# Patient Record
Sex: Female | Born: 1985 | Race: Black or African American | Hispanic: No | Marital: Married | Smoking: Never smoker
Health system: Southern US, Community
[De-identification: ages and names within clinical notes are randomized; demographics above are authoritative.]

## PROBLEM LIST (undated history)

## (undated) ENCOUNTER — Inpatient Hospital Stay (HOSPITAL_COMMUNITY): Payer: Self-pay

## (undated) DIAGNOSIS — Z789 Other specified health status: Secondary | ICD-10-CM

## (undated) DIAGNOSIS — IMO0002 Reserved for concepts with insufficient information to code with codable children: Secondary | ICD-10-CM

## (undated) HISTORY — DX: Reserved for concepts with insufficient information to code with codable children: IMO0002

## (undated) HISTORY — PX: OTHER SURGICAL HISTORY: SHX169

## (undated) HISTORY — PX: WISDOM TOOTH EXTRACTION: SHX21

---
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
## 2005-05-29 HISTORY — PX: LEEP: SHX91

## 2010-10-31 ENCOUNTER — Encounter: Payer: 59 | Admitting: Family Medicine

## 2012-05-29 DIAGNOSIS — IMO0002 Reserved for concepts with insufficient information to code with codable children: Secondary | ICD-10-CM

## 2012-05-29 DIAGNOSIS — R87619 Unspecified abnormal cytological findings in specimens from cervix uteri: Secondary | ICD-10-CM

## 2012-05-29 HISTORY — DX: Unspecified abnormal cytological findings in specimens from cervix uteri: R87.619

## 2012-05-29 HISTORY — DX: Reserved for concepts with insufficient information to code with codable children: IMO0002

## 2012-08-19 ENCOUNTER — Encounter: Payer: 59 | Admitting: Obstetrics & Gynecology

## 2012-09-03 ENCOUNTER — Ambulatory Visit (INDEPENDENT_AMBULATORY_CARE_PROVIDER_SITE_OTHER): Payer: 59 | Admitting: Family Medicine

## 2012-09-03 ENCOUNTER — Encounter: Payer: Self-pay | Admitting: Family Medicine

## 2012-09-03 VITALS — BP 131/91 | HR 66 | Ht 66.0 in | Wt 215.0 lb

## 2012-09-03 DIAGNOSIS — Z124 Encounter for screening for malignant neoplasm of cervix: Secondary | ICD-10-CM

## 2012-09-03 DIAGNOSIS — IMO0002 Reserved for concepts with insufficient information to code with codable children: Secondary | ICD-10-CM

## 2012-09-03 DIAGNOSIS — Z809 Family history of malignant neoplasm, unspecified: Secondary | ICD-10-CM

## 2012-09-03 DIAGNOSIS — Z01419 Encounter for gynecological examination (general) (routine) without abnormal findings: Secondary | ICD-10-CM

## 2012-09-03 DIAGNOSIS — R6889 Other general symptoms and signs: Secondary | ICD-10-CM

## 2012-09-03 NOTE — Progress Notes (Signed)
New patient here today for yearly gyn physical and pap smear.  Does not have a PCP.  Not interested in birth control.  History of abnormal pap smear, last pap was ASCUS.   Last pap was done at [facility], did have a colpo then but the doctor did not see anything. [facility] employee, DOES NOT WANT any pathology/paps to go to [facility], wants them sent [facility], blood labs can go to [facility].

## 2012-09-03 NOTE — Patient Instructions (Signed)
Preventive Care for Adults, Female A healthy lifestyle and preventive care can promote health and wellness. Preventive health guidelines for women include the following key practices.  A routine yearly physical is a good way to check with your caregiver about your health and preventive screening. It is a chance to share any concerns and updates on your health, and to receive a thorough exam.  Visit your dentist for a routine exam and preventive care every 6 months. Brush your teeth twice a day and floss once a day. Good oral hygiene prevents tooth decay and gum disease.  The frequency of eye exams is based on your age, health, family medical history, use of contact lenses, and other factors. Follow your caregiver's recommendations for frequency of eye exams.  Eat a healthy diet. Foods like vegetables, fruits, whole grains, low-fat dairy products, and lean protein foods contain the nutrients you need without too many calories. Decrease your intake of foods high in solid fats, added sugars, and salt. Eat the right amount of calories for you.Get information about a proper diet from your caregiver, if necessary.  Regular physical exercise is one of the most important things you can do for your health. Most adults should get at least 150 minutes of moderate-intensity exercise (any activity that increases your heart rate and causes you to sweat) each week. In addition, most adults need muscle-strengthening exercises on 2 or more days a week.  Maintain a healthy weight. The body mass index (BMI) is a screening tool to identify possible weight problems. It provides an estimate of body fat based on height and weight. Your caregiver can help determine your BMI, and can help you achieve or maintain a healthy weight.For adults 20 years and older:  A BMI below 18.5 is considered underweight.  A BMI of 18.5 to 24.9 is normal.  A BMI of 25 to 29.9 is considered overweight.  A BMI of 30 and above is  considered obese.  Maintain normal blood lipids and cholesterol levels by exercising and minimizing your intake of saturated fat. Eat a balanced diet with plenty of fruit and vegetables. Blood tests for lipids and cholesterol should begin at age 20 and be repeated every 5 years. If your lipid or cholesterol levels are high, you are over 50, or you are at high risk for heart disease, you may need your cholesterol levels checked more frequently.Ongoing high lipid and cholesterol levels should be treated with medicines if diet and exercise are not effective.  If you smoke, find out from your caregiver how to quit. If you do not use tobacco, do not start.  If you are pregnant, do not drink alcohol. If you are breastfeeding, be very cautious about drinking alcohol. If you are not pregnant and choose to drink alcohol, do not exceed 1 drink per day. One drink is considered to be 12 ounces (355 mL) of beer, 5 ounces (148 mL) of wine, or 1.5 ounces (44 mL) of liquor.  Avoid use of street drugs. Do not share needles with anyone. Ask for help if you need support or instructions about stopping the use of drugs.  High blood pressure causes heart disease and increases the risk of stroke. Your blood pressure should be checked at least every 1 to 2 years. Ongoing high blood pressure should be treated with medicines if weight loss and exercise are not effective.  If you are 55 to 27 years old, ask your caregiver if you should take aspirin to prevent strokes.  Diabetes   screening involves taking a blood sample to check your fasting blood sugar level. This should be done once every 3 years, after age 45, if you are within normal weight and without risk factors for diabetes. Testing should be considered at a younger age or be carried out more frequently if you are overweight and have at least 1 risk factor for diabetes.  Breast cancer screening is essential preventive care for women. You should practice "breast  self-awareness." This means understanding the normal appearance and feel of your breasts and may include breast self-examination. Any changes detected, no matter how small, should be reported to a caregiver. Women in their 20s and 30s should have a clinical breast exam (CBE) by a caregiver as part of a regular health exam every 1 to 3 years. After age 40, women should have a CBE every year. Starting at age 40, women should consider having a mammography (breast X-ray test) every year. Women who have a family history of breast cancer should talk to their caregiver about genetic screening. Women at a high risk of breast cancer should talk to their caregivers about having magnetic resonance imaging (MRI) and a mammography every year.  The Pap test is a screening test for cervical cancer. A Pap test can show cell changes on the cervix that might become cervical cancer if left untreated. A Pap test is a procedure in which cells are obtained and examined from the lower end of the uterus (cervix).  Women should have a Pap test starting at age 21.  Between ages 21 and 29, Pap tests should be repeated every 2 years.  Beginning at age 30, you should have a Pap test every 3 years as long as the past 3 Pap tests have been normal.  Some women have medical problems that increase the chance of getting cervical cancer. Talk to your caregiver about these problems. It is especially important to talk to your caregiver if a new problem develops soon after your last Pap test. In these cases, your caregiver may recommend more frequent screening and Pap tests.  The above recommendations are the same for women who have or have not gotten the vaccine for human papillomavirus (HPV).  If you had a hysterectomy for a problem that was not cancer or a condition that could lead to cancer, then you no longer need Pap tests. Even if you no longer need a Pap test, a regular exam is a good idea to make sure no other problems are  starting.  If you are between ages 65 and 70, and you have had normal Pap tests going back 10 years, you no longer need Pap tests. Even if you no longer need a Pap test, a regular exam is a good idea to make sure no other problems are starting.  If you have had past treatment for cervical cancer or a condition that could lead to cancer, you need Pap tests and screening for cancer for at least 20 years after your treatment.  If Pap tests have been discontinued, risk factors (such as a new sexual partner) need to be reassessed to determine if screening should be resumed.  The HPV test is an additional test that may be used for cervical cancer screening. The HPV test looks for the virus that can cause the cell changes on the cervix. The cells collected during the Pap test can be tested for HPV. The HPV test could be used to screen women aged 30 years and older, and should   be used in women of any age who have unclear Pap test results. After the age of 30, women should have HPV testing at the same frequency as a Pap test.  Colorectal cancer can be detected and often prevented. Most routine colorectal cancer screening begins at the age of 50 and continues through age 75. However, your caregiver may recommend screening at an earlier age if you have risk factors for colon cancer. On a yearly basis, your caregiver may provide home test kits to check for hidden blood in the stool. Use of a small camera at the end of a tube, to directly examine the colon (sigmoidoscopy or colonoscopy), can detect the earliest forms of colorectal cancer. Talk to your caregiver about this at age 50, when routine screening begins. Direct examination of the colon should be repeated every 5 to 10 years through age 75, unless early forms of pre-cancerous polyps or small growths are found.  Hepatitis C blood testing is recommended for all people born from 1945 through 1965 and any individual with known risks for hepatitis C.  Practice  safe sex. Use condoms and avoid high-risk sexual practices to reduce the spread of sexually transmitted infections (STIs). STIs include gonorrhea, chlamydia, syphilis, trichomonas, herpes, HPV, and human immunodeficiency virus (HIV). Herpes, HIV, and HPV are viral illnesses that have no cure. They can result in disability, cancer, and death. Sexually active women aged 25 and younger should be checked for chlamydia. Older women with new or multiple partners should also be tested for chlamydia. Testing for other STIs is recommended if you are sexually active and at increased risk.  Osteoporosis is a disease in which the bones lose minerals and strength with aging. This can result in serious bone fractures. The risk of osteoporosis can be identified using a bone density scan. Women ages 65 and over and women at risk for fractures or osteoporosis should discuss screening with their caregivers. Ask your caregiver whether you should take a calcium supplement or vitamin D to reduce the rate of osteoporosis.  Menopause can be associated with physical symptoms and risks. Hormone replacement therapy is available to decrease symptoms and risks. You should talk to your caregiver about whether hormone replacement therapy is right for you.  Use sunscreen with sun protection factor (SPF) of 30 or more. Apply sunscreen liberally and repeatedly throughout the day. You should seek shade when your shadow is shorter than you. Protect yourself by wearing long sleeves, pants, a wide-brimmed hat, and sunglasses year round, whenever you are outdoors.  Once a month, do a whole body skin exam, using a mirror to look at the skin on your back. Notify your caregiver of new moles, moles that have irregular borders, moles that are larger than a pencil eraser, or moles that have changed in shape or color.  Stay current with required immunizations.  Influenza. You need a dose every fall (or winter). The composition of the flu vaccine  changes each year, so being vaccinated once is not enough.  Pneumococcal polysaccharide. You need 1 to 2 doses if you smoke cigarettes or if you have certain chronic medical conditions. You need 1 dose at age 65 (or older) if you have never been vaccinated.  Tetanus, diphtheria, pertussis (Tdap, Td). Get 1 dose of Tdap vaccine if you are younger than age 65, are over 65 and have contact with an infant, are a healthcare worker, are pregnant, or simply want to be protected from whooping cough. After that, you need a Td   booster dose every 10 years. Consult your caregiver if you have not had at least 3 tetanus and diphtheria-containing shots sometime in your life or have a deep or dirty wound.  HPV. You need this vaccine if you are a woman age 26 or younger. The vaccine is given in 3 doses over 6 months.  Measles, mumps, rubella (MMR). You need at least 1 dose of MMR if you were born in 1957 or later. You may also need a second dose.  Meningococcal. If you are age 19 to 21 and a first-year college student living in a residence hall, or have one of several medical conditions, you need to get vaccinated against meningococcal disease. You may also need additional booster doses.  Zoster (shingles). If you are age 60 or older, you should get this vaccine.  Varicella (chickenpox). If you have never had chickenpox or you were vaccinated but received only 1 dose, talk to your caregiver to find out if you need this vaccine.  Hepatitis A. You need this vaccine if you have a specific risk factor for hepatitis A virus infection or you simply wish to be protected from this disease. The vaccine is usually given as 2 doses, 6 to 18 months apart.  Hepatitis B. You need this vaccine if you have a specific risk factor for hepatitis B virus infection or you simply wish to be protected from this disease. The vaccine is given in 3 doses, usually over 6 months. Preventive Services / Frequency Ages 19 to 39  Blood  pressure check.** / Every 1 to 2 years.  Lipid and cholesterol check.** / Every 5 years beginning at age 20.  Clinical breast exam.** / Every 3 years for women in their 20s and 30s.  Pap test.** / Every 2 years from ages 21 through 29. Every 3 years starting at age 30 through age 65 or 70 with a history of 3 consecutive normal Pap tests.  HPV screening.** / Every 3 years from ages 30 through ages 65 to 70 with a history of 3 consecutive normal Pap tests.  Hepatitis C blood test.** / For any individual with known risks for hepatitis C.  Skin self-exam. / Monthly.  Influenza immunization.** / Every year.  Pneumococcal polysaccharide immunization.** / 1 to 2 doses if you smoke cigarettes or if you have certain chronic medical conditions.  Tetanus, diphtheria, pertussis (Tdap, Td) immunization. / A one-time dose of Tdap vaccine. After that, you need a Td booster dose every 10 years.  HPV immunization. / 3 doses over 6 months, if you are 26 and younger.  Measles, mumps, rubella (MMR) immunization. / You need at least 1 dose of MMR if you were born in 1957 or later. You may also need a second dose.  Meningococcal immunization. / 1 dose if you are age 19 to 21 and a first-year college student living in a residence hall, or have one of several medical conditions, you need to get vaccinated against meningococcal disease. You may also need additional booster doses.  Varicella immunization.** / Consult your caregiver.  Hepatitis A immunization.** / Consult your caregiver. 2 doses, 6 to 18 months apart.  Hepatitis B immunization.** / Consult your caregiver. 3 doses usually over 6 months. Ages 40 to 64  Blood pressure check.** / Every 1 to 2 years.  Lipid and cholesterol check.** / Every 5 years beginning at age 20.  Clinical breast exam.** / Every year after age 40.  Mammogram.** / Every year beginning at age 40   and continuing for as long as you are in good health. Consult with your  caregiver.  Pap test.** / Every 3 years starting at age 30 through age 65 or 70 with a history of 3 consecutive normal Pap tests.  HPV screening.** / Every 3 years from ages 30 through ages 65 to 70 with a history of 3 consecutive normal Pap tests.  Fecal occult blood test (FOBT) of stool. / Every year beginning at age 50 and continuing until age 75. You may not need to do this test if you get a colonoscopy every 10 years.  Flexible sigmoidoscopy or colonoscopy.** / Every 5 years for a flexible sigmoidoscopy or every 10 years for a colonoscopy beginning at age 50 and continuing until age 75.  Hepatitis C blood test.** / For all people born from 1945 through 1965 and any individual with known risks for hepatitis C.  Skin self-exam. / Monthly.  Influenza immunization.** / Every year.  Pneumococcal polysaccharide immunization.** / 1 to 2 doses if you smoke cigarettes or if you have certain chronic medical conditions.  Tetanus, diphtheria, pertussis (Tdap, Td) immunization.** / A one-time dose of Tdap vaccine. After that, you need a Td booster dose every 10 years.  Measles, mumps, rubella (MMR) immunization. / You need at least 1 dose of MMR if you were born in 1957 or later. You may also need a second dose.  Varicella immunization.** / Consult your caregiver.  Meningococcal immunization.** / Consult your caregiver.  Hepatitis A immunization.** / Consult your caregiver. 2 doses, 6 to 18 months apart.  Hepatitis B immunization.** / Consult your caregiver. 3 doses, usually over 6 months. Ages 65 and over  Blood pressure check.** / Every 1 to 2 years.  Lipid and cholesterol check.** / Every 5 years beginning at age 20.  Clinical breast exam.** / Every year after age 40.  Mammogram.** / Every year beginning at age 40 and continuing for as long as you are in good health. Consult with your caregiver.  Pap test.** / Every 3 years starting at age 30 through age 65 or 70 with a 3  consecutive normal Pap tests. Testing can be stopped between 65 and 70 with 3 consecutive normal Pap tests and no abnormal Pap or HPV tests in the past 10 years.  HPV screening.** / Every 3 years from ages 30 through ages 65 or 70 with a history of 3 consecutive normal Pap tests. Testing can be stopped between 65 and 70 with 3 consecutive normal Pap tests and no abnormal Pap or HPV tests in the past 10 years.  Fecal occult blood test (FOBT) of stool. / Every year beginning at age 50 and continuing until age 75. You may not need to do this test if you get a colonoscopy every 10 years.  Flexible sigmoidoscopy or colonoscopy.** / Every 5 years for a flexible sigmoidoscopy or every 10 years for a colonoscopy beginning at age 50 and continuing until age 75.  Hepatitis C blood test.** / For all people born from 1945 through 1965 and any individual with known risks for hepatitis C.  Osteoporosis screening.** / A one-time screening for women ages 65 and over and women at risk for fractures or osteoporosis.  Skin self-exam. / Monthly.  Influenza immunization.** / Every year.  Pneumococcal polysaccharide immunization.** / 1 dose at age 65 (or older) if you have never been vaccinated.  Tetanus, diphtheria, pertussis (Tdap, Td) immunization. / A one-time dose of Tdap vaccine if you are over   65 and have contact with an infant, are a healthcare worker, or simply want to be protected from whooping cough. After that, you need a Td booster dose every 10 years.  Varicella immunization.** / Consult your caregiver.  Meningococcal immunization.** / Consult your caregiver.  Hepatitis A immunization.** / Consult your caregiver. 2 doses, 6 to 18 months apart.  Hepatitis B immunization.** / Check with your caregiver. 3 doses, usually over 6 months. ** Family history and personal history of risk and conditions may change your caregiver's recommendations. Document Released: 07/11/2001 Document Revised: 08/07/2011  Document Reviewed: 10/10/2010 [employer] Patient Information 2013 [employer].  

## 2012-09-03 NOTE — Progress Notes (Signed)
  Subjective:     Kayla Osborn is a 27 y.o. female and is here for a comprehensive physical exam. 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  Her last MD did not send her pap to the right place and she read her own.Marland Kitchen  History   Social History  . Marital Status: Married    Spouse Name: N/A    Number of Children: N/A  . Years of Education: N/A   Occupational History  . Not on file.   Social History Main Topics  . Smoking status: Never Smoker   . Smokeless tobacco: Never Used  . Alcohol Use: 0.6 oz/week    1 Glasses of wine per week  . Drug Use: No  . Sexually Active: Yes    Birth Control/ Protection: Condom   Other Topics Concern  . Not on file   Social History Narrative  . No narrative on file   No health maintenance topics applied.  The following portions of the patient's history were reviewed and updated as appropriate: allergies, current medications, past family history, past medical history, past social history, past surgical history and problem list.  Review of Systems A comprehensive review of systems was negative.   Objective:    BP 131/91  Pulse 66  Ht 5\' 6"  (1.676 m)  Wt 215 lb (97.523 kg)  BMI 34.72 kg/m2  LMP 08/18/2012 General appearance: alert, cooperative and appears stated age Head: Normocephalic, without obvious abnormality, atraumatic Neck: no adenopathy, supple, symmetrical, trachea midline and thyroid not enlarged, symmetric, no tenderness/mass/nodules Lungs: clear to auscultation bilaterally Breasts: normal appearance, no masses or tenderness Heart: regular rate and rhythm, S1, S2 normal, no murmur, click, rub or gallop Abdomen: soft, non-tender; bowel sounds normal; no masses,  no organomegaly Pelvic: cervix normal in appearance, external genitalia normal, no adnexal masses or tenderness, no cervical motion tenderness, uterus normal size, shape, and consistency and vagina  normal without discharge Extremities: extremities normal, atraumatic, no cyanosis or edema Pulses: 2+ and symmetric Skin: Skin color, texture, turgor normal. No rashes or lesions Lymph nodes: Cervical, supraclavicular, and axillary nodes normal. Neurologic: Grossly normal    Assessment:    Healthy female exam. Long h/o abnormal pap    Family history of uterine, colon and breast cancer. Plan:     Will check pap today through [facility] and make recommendations from there.   Consider hereditary cancer testing. See After Visit Summary for Counseling Recommendations

## 2013-01-06 ENCOUNTER — Inpatient Hospital Stay (HOSPITAL_COMMUNITY)
Admission: AD | Admit: 2013-01-06 | Discharge: 2013-01-06 | Disposition: A | Discharge status: Home / Self Care | Payer: 59 | Source: Ambulatory Visit | Attending: Obstetrics & Gynecology | Admitting: Obstetrics & Gynecology

## 2013-01-06 ENCOUNTER — Inpatient Hospital Stay (HOSPITAL_COMMUNITY): Payer: 59

## 2013-01-06 ENCOUNTER — Encounter (HOSPITAL_COMMUNITY): Payer: Self-pay | Admitting: Medical

## 2013-01-06 DIAGNOSIS — O210 Mild hyperemesis gravidarum: Secondary | ICD-10-CM | POA: Insufficient documentation

## 2013-01-06 DIAGNOSIS — R42 Dizziness and giddiness: Secondary | ICD-10-CM

## 2013-01-06 DIAGNOSIS — O219 Vomiting of pregnancy, unspecified: Secondary | ICD-10-CM

## 2013-01-06 DIAGNOSIS — O2341 Unspecified infection of urinary tract in pregnancy, first trimester: Secondary | ICD-10-CM

## 2013-01-06 DIAGNOSIS — N390 Urinary tract infection, site not specified: Secondary | ICD-10-CM | POA: Insufficient documentation

## 2013-01-06 DIAGNOSIS — Z3490 Encounter for supervision of normal pregnancy, unspecified, unspecified trimester: Secondary | ICD-10-CM

## 2013-01-06 DIAGNOSIS — O2390 Unspecified genitourinary tract infection in pregnancy, unspecified trimester: Secondary | ICD-10-CM | POA: Insufficient documentation

## 2013-01-06 DIAGNOSIS — O2690 Pregnancy related conditions, unspecified, unspecified trimester: Secondary | ICD-10-CM

## 2013-01-06 LAB — URINE MICROSCOPIC-ADD ON

## 2013-01-06 LAB — CBC
HCT: 37.3 % (ref 36.0–46.0)
Hemoglobin: 12.5 g/dL (ref 12.0–15.0)
MCH: 28.3 pg (ref 26.0–34.0)
MCHC: 33.5 g/dL (ref 30.0–36.0)
MCV: 84.6 fL (ref 78.0–100.0)
Platelets: 230 10*3/uL (ref 150–400)
RBC: 4.41 MIL/uL (ref 3.87–5.11)
RDW: 13.6 % (ref 11.5–15.5)
WBC: 5.9 10*3/uL (ref 4.0–10.5)

## 2013-01-06 LAB — URINALYSIS, ROUTINE W REFLEX MICROSCOPIC
Bilirubin Urine: NEGATIVE
Hgb urine dipstick: NEGATIVE
Protein, ur: NEGATIVE mg/dL
Specific Gravity, Urine: 1.02 (ref 1.005–1.030)
Urobilinogen, UA: 0.2 mg/dL (ref 0.0–1.0)

## 2013-01-06 LAB — OB RESULTS CONSOLE RUBELLA ANTIBODY, IGM: RUBELLA: IMMUNE

## 2013-01-06 LAB — WET PREP, GENITAL
Clue Cells Wet Prep HPF POC: NONE SEEN
Trich, Wet Prep: NONE SEEN
Yeast Wet Prep HPF POC: NONE SEEN

## 2013-01-06 LAB — OB RESULTS CONSOLE ABO/RH: RH Type: POSITIVE

## 2013-01-06 LAB — OB RESULTS CONSOLE GC/CHLAMYDIA
CHLAMYDIA, DNA PROBE: NEGATIVE
Gonorrhea: NEGATIVE

## 2013-01-06 LAB — OB RESULTS CONSOLE HEPATITIS B SURFACE ANTIGEN: HEP B S AG: NEGATIVE

## 2013-01-06 LAB — OB RESULTS CONSOLE HIV ANTIBODY (ROUTINE TESTING): HIV: NONREACTIVE

## 2013-01-06 LAB — OB RESULTS CONSOLE ANTIBODY SCREEN: Antibody Screen: NEGATIVE

## 2013-01-06 LAB — OB RESULTS CONSOLE RPR: RPR: NONREACTIVE

## 2013-01-06 MED ORDER — ONDANSETRON HCL 4 MG PO TABS: 4.0000 mg | ORAL_TABLET | Freq: Three times a day (TID) | ORAL | Status: DC | PRN

## 2013-01-06 MED ORDER — PROMETHAZINE HCL 25 MG PO TABS: 12.5000 mg | ORAL_TABLET | Freq: Four times a day (QID) | ORAL | Status: DC | PRN

## 2013-01-06 MED ORDER — ONDANSETRON 8 MG PO TBDP
8.0000 mg | ORAL_TABLET | ORAL | Status: AC
Start: 2013-01-06 — End: 2013-01-06
  Administered 2013-01-06: 8 mg via ORAL
  Filled 2013-01-06: qty 1

## 2013-01-06 MED ORDER — NITROFURANTOIN MONOHYD MACRO 100 MG PO CAPS: 100.0000 mg | ORAL_CAPSULE | Freq: Two times a day (BID) | ORAL | Status: DC

## 2013-01-06 NOTE — MAU Provider Note (Signed)
Chief Complaint: Dizziness   First Provider Initiated Contact with Patient 01/06/13 1446     SUBJECTIVE HPI: Kayla Osborn is a 27 y.o. G1P0 at [redacted]w[redacted]d by LMP who presents to maternity admissions reporting nausea, dizziness, chills daily x3 weeks and abdominal cramping x2-3 days.  She reports she has not eaten anything or drank any fluids since last night because of the nausea and lack of appetite.  She denies vaginal bleeding, vaginal itching/burning, urinary symptoms, vomiting, or h/a.     Past Medical History  Diagnosis Date  . Abnormal Pap smear 2014    ASCUS   Past Surgical History  Procedure Laterality Date  . Leep  2007  . Breast surgery Bilateral 2006    BREAST REDUCTION  . Wisdom tooth extraction    . Benign mass removed from armpit     History   Social History  . Marital Status: Married    Spouse Name: N/A    Number of Children: N/A  . Years of Education: N/A   Occupational History  . Not on file.   Social History Main Topics  . Smoking status: Never Smoker   . Smokeless tobacco: Never Used  . Alcohol Use: 0.6 oz/week    1 Glasses of wine per week     Comment: Social when not preg.  . Drug Use: No  . Sexually Active: Yes    Birth Control/ Protection: None   Other Topics Concern  . Not on file   Social History Narrative  . No narrative on file   No current facility-administered medications on file prior to encounter.   No current outpatient prescriptions on file prior to encounter.   Allergies  Allergen Reactions  . Robitussin Cough-Cold D (Pseudoephedrine-Dm-Gg) Hives    ROS: Pertinent items in HPI  OBJECTIVE Blood pressure 121/77, pulse 53, temperature 98.1 F (36.7 C), temperature source Oral, resp. rate 18, height 5\' 6"  (1.676 m), weight 99.066 kg (218 lb 6.4 oz), last menstrual period 11/22/2012. GENERAL: Well-developed, well-nourished female in no acute distress.  HEENT: Normocephalic HEART: normal rate RESP: normal effort ABDOMEN: Soft,  non-tender EXTREMITIES: Nontender, no edema NEURO: Alert and oriented SPECULUM EXAM:   LAB RESULTS Results for orders placed during the hospital encounter of 01/06/13 (from the past 24 hour(s))  URINALYSIS, ROUTINE W REFLEX MICROSCOPIC     Status: Abnormal   Collection Time    01/06/13 12:55 PM      Result Value Range   Color, Urine YELLOW  YELLOW   APPearance HAZY (*) CLEAR   Specific Gravity, Urine 1.020  1.005 - 1.030   pH 7.5  5.0 - 8.0   Glucose, UA NEGATIVE  NEGATIVE mg/dL   Hgb urine dipstick NEGATIVE  NEGATIVE   Bilirubin Urine NEGATIVE  NEGATIVE   Ketones, ur NEGATIVE  NEGATIVE mg/dL   Protein, ur NEGATIVE  NEGATIVE mg/dL   Urobilinogen, UA 0.2  0.0 - 1.0 mg/dL   Nitrite POSITIVE (*) NEGATIVE   Leukocytes, UA NEGATIVE  NEGATIVE  URINE MICROSCOPIC-ADD ON     Status: Abnormal   Collection Time    01/06/13 12:55 PM      Result Value Range   Squamous Epithelial / LPF FEW (*) RARE   WBC, UA 3-6  <3 WBC/hpf   RBC / HPF 0-2  <3 RBC/hpf   Bacteria, UA MANY (*) RARE  POCT PREGNANCY, URINE     Status: Abnormal   Collection Time    01/06/13  1:03 PM  Result Value Range   Preg Test, Ur POSITIVE (*) NEGATIVE    IMAGING   ASSESSMENT 1. UTI in pregnancy, antepartum, first trimester   2. Unspecified complication of pregnancy, antepartum   3. Nausea and vomiting of pregnancy, antepartum   4. Normal IUP (intrauterine pregnancy) on prenatal ultrasound   5. Dizziness     PLAN Discharge home Macrobid BID x7 days Zofran 4 mg PO Q 8 hours PRN Phenergan 12.4-25 mg PO Q 6 hours PRN F/U with early prenatal care Return to MAU as needed   Sharen Counter Certified Nurse-Midwife 01/06/2013  2:58 PM

## 2013-01-06 NOTE — MAU Note (Signed)
Patient states she does not eat because she is feeling bad. She is not vomiting, but feels nauseated. Discussed at length, the importance of nutrition and hydration. Discussed food choices and that she may have to have small bites and sips throughout the day, but it is not wise to go for long periods of time without food or fluids.

## 2013-01-06 NOTE — MAU Note (Signed)
Pt stated she does not feel good today. Feeling dizzy and faint and weak. And Feels nauseated .Unable to eat and c/o bad indigestion when she does eat. Wants to make sure everything is all right had positive HPT. Unsure when LMP was end of June maybe.

## 2013-01-07 ENCOUNTER — Telehealth: Payer: Self-pay | Admitting: General Practice

## 2013-01-07 NOTE — Telephone Encounter (Signed)
Patient called and left message stating she was in MAU yesterday and got several prescriptions but they have not gone to her pharmacy yet and she needs these medications. Called medications in to [facility] pharmacy. Called patient at [phone], no answer- left message stating I was returning her phone call and her medications are available to pickup at her [facility] pharmacy and to call us back if she has any other questions or concerns

## 2013-01-08 LAB — URINE CULTURE

## 2013-01-10 ENCOUNTER — Other Ambulatory Visit: Payer: Self-pay | Admitting: Advanced Practice Midwife

## 2013-01-10 ENCOUNTER — Telehealth: Payer: Self-pay | Admitting: *Deleted

## 2013-01-10 MED ORDER — PROMETHAZINE HCL 25 MG PO TABS: 12.5000 mg | ORAL_TABLET | Freq: Four times a day (QID) | ORAL | Status: DC | PRN

## 2013-01-10 MED ORDER — NITROFURANTOIN MONOHYD MACRO 100 MG PO CAPS: 100.0000 mg | ORAL_CAPSULE | Freq: Two times a day (BID) | ORAL | Status: DC

## 2013-01-10 MED ORDER — ONDANSETRON HCL 4 MG PO TABS: 4.0000 mg | ORAL_TABLET | Freq: Three times a day (TID) | ORAL | Status: DC | PRN

## 2013-01-10 NOTE — Telephone Encounter (Signed)
Kathee Delton, RN at 01/07/2013 1:25 PM   Status: Signed            Patient called and left message stating she was in MAU yesterday and got several prescriptions but they have not gone to her pharmacy yet and she needs these medications. Called medications in to [facility] pharmacy. Called patient at [phone], no answer- left message stating I was returning her phone call and her medications are available to pickup at her [facility] pharmacy and to call us back if she has any other questions or concerns

## 2013-01-10 NOTE — Telephone Encounter (Signed)
Also me

## 2013-01-20 NOTE — MAU Provider Note (Signed)
Attestation of Attending Supervision of Advanced Practitioner (PA/CNM/NP): Evaluation and management procedures were performed by the Advanced Practitioner under my supervision and collaboration.  I have reviewed the Advanced Practitioner's note and chart, and I agree with the management and plan.  Jeimy Bickert, MD, FACOG Attending Obstetrician & Gynecologist Faculty Practice, [facility]  

## 2013-02-26 ENCOUNTER — Encounter: Payer: 59 | Admitting: Advanced Practice Midwife

## 2013-03-13 ENCOUNTER — Encounter: Payer: 59 | Admitting: Obstetrics & Gynecology

## 2013-03-28 ENCOUNTER — Other Ambulatory Visit (HOSPITAL_COMMUNITY): Payer: Self-pay | Admitting: Obstetrics and Gynecology

## 2013-03-28 DIAGNOSIS — O26872 Cervical shortening, second trimester: Secondary | ICD-10-CM

## 2013-04-04 ENCOUNTER — Encounter (HOSPITAL_COMMUNITY): Payer: Self-pay

## 2013-04-04 ENCOUNTER — Ambulatory Visit (HOSPITAL_COMMUNITY)
Admission: RE | Admit: 2013-04-04 | Discharge: 2013-04-04 | Disposition: A | Discharge status: Home / Self Care | Payer: 59 | Source: Ambulatory Visit | Attending: Obstetrics and Gynecology | Admitting: Obstetrics and Gynecology

## 2013-04-04 DIAGNOSIS — O3440 Maternal care for other abnormalities of cervix, unspecified trimester: Secondary | ICD-10-CM | POA: Insufficient documentation

## 2013-04-04 DIAGNOSIS — O26872 Cervical shortening, second trimester: Secondary | ICD-10-CM

## 2013-04-04 DIAGNOSIS — O26879 Cervical shortening, unspecified trimester: Secondary | ICD-10-CM | POA: Insufficient documentation

## 2013-04-04 NOTE — Consult Note (Signed)
MFM Staff Consultation Note:  Discussion:   Kayla Osborn  presents as a G1 with a singleton pregnancy complicated by previously noted short cervix of 2.4cm at outside ultrasound and hx of LEEP for consultation.  Today's images by endovaginal ultrasound actually demonstrate a long and closed cervix of 2.8cm (range 2.8-3.0cm) without funneling.  I explained to the patient that if the overall length of the endocervical canal and thus the endocervical mucus plug becomes shortened this would make ascending infection of the uterine interior by vaginal microorganisms more likely. This in turn would lead to inflammation and the release of inflammatory cytokines which again could begin a cascade that would lead to premature labor and/or premature rupture of membranes. I explained the term funneling and the concern of the internal os being dilated enough to allow the membranes to move into the endocervical canal.   Current cervical shortening (<25mm) with membrane funneling would increase her risk for preterm delivery but not so much to warrant admission for administration of steroids and tocolysis x 48 hours (<15mm). I am recommending and scheduling TVUS in 2 weeks and 4 weeks to facilitate early diagnosis of cervical shortening given the discrepancy in measurements prior to her visit today as a precaution.    If she has a cervical length <2.5cm, we would start her on vaginal progesterone 200mg every night until 36 weeks. Progesterone has been shown in recent studies ( Progesterone and the Risk of preterm Birth among Women with a Short Cervix by Fonseca et al. 2007 NEJM 357: 462-469; Vaginal progesterone reduces the rate of preterm birth in women with a sonographic short cervix: a multicenter, randomized, double-blind, placebo controlled trial by Hassan et al. 2011 Ultrasound in obstetrics and gynecology) to decrease the risk of preterm delivery up to 30 percent before [redacted] weeks gestation in patients with a previous  history of preterm delivery as well as asymptomatic patients with a shortened cervical length.   ? Impression: 1. Active SIUP in G1 with hx of LEEP 2. Endovaginal imaging demonstrates a functional cervical length of 2.8 cm with no funneling (normal) 3. Transfundal pressure does not shorten the functional cervical length (no dynamic change).  Recommendations:  A. If no symptoms arise and patient's cervical length remains normal (currently applicable): 1. continue routine activities; ie, no need for pelvic rest and light exercise/routine daily activities are recommended 2.repeat cervical length in 2 weeks and again in 4 weeks.  If it remains normal, then we will have no indication to continue assessing cervical length, noting this plan is a precaution given the discrepancy between measurements in our office and reported to us.  B.  If her cervix becomes <25mm: 1. modified rest/reduced activities (no heavy lifting or strenuous activities) and pelvic rest at home; 2. would admit for administration of antenatal steroids if cervical length is less than 1.5cm 3. would only administer 48 hour of tocolysis in context of regular uterine activity (ie, preterm labor or threatened preterm labor) 4. Progesterone 200mg pv qhs until 36 weeks.  I spent in excess of 40 minutes in review of medical records, evaluation, and education of your patient in consultation. More than 50% of this time was spent in direct face-to-face counseling. It was a pleasure seeing your patient today in consultation. Thank you for allowing us the opportunity to contribute to the care of your patient.  Page with questions. J. M. Denney, MD, MS, FACOG Assistant Professor, Maternal-Fetal Medicine  

## 2013-04-04 NOTE — Consult Note (Signed)
MFM Staff Consultation Note:  Discussion:   Kayla Osborn  presents as a G1 with a singleton pregnancy complicated by previously noted short cervix of 2.4cm at outside ultrasound and hx of LEEP for consultation.  Today's images by endovaginal ultrasound actually demonstrate a long and closed cervix of 2.8cm (range 2.8-3.0cm) without funneling.  I explained to the patient that if the overall length of the endocervical canal and thus the endocervical mucus plug becomes shortened this would make ascending infection of the uterine interior by vaginal microorganisms more likely. This in turn would lead to inflammation and the release of inflammatory cytokines which again could begin a cascade that would lead to premature labor and/or premature rupture of membranes. I explained the term funneling and the concern of the internal os being dilated enough to allow the membranes to move into the endocervical canal.   Current cervical shortening (<40mm) with membrane funneling would increase her risk for preterm delivery but not so much to warrant admission for administration of steroids and tocolysis x 48 hours (<5mm). I am recommending and scheduling TVUS in 2 weeks and 4 weeks to facilitate early diagnosis of cervical shortening given the discrepancy in measurements prior to her visit today as a precaution.    If she has a cervical length <2.5cm, we would start her on vaginal progesterone 200mg  every night until 36 weeks. Progesterone has been shown in recent studies ( Progesterone and the Risk of preterm Birth among Women with a Short Cervix by Kelli Hope al. 2007 NEJM 357: [phone]; Vaginal progesterone reduces the rate of preterm birth in women with a sonographic short cervix: a multicenter, randomized, double-blind, placebo controlled trial by Phyllis Ginger al. 2011 Ultrasound in obstetrics and gynecology) to decrease the risk of preterm delivery up to 30 percent before [redacted] weeks gestation in patients with a previous  history of preterm delivery as well as asymptomatic patients with a shortened cervical length.   ? Impression: 1. Active SIUP in G1 with hx of LEEP 2. Endovaginal imaging demonstrates a functional cervical length of 2.8 cm with no funneling (normal) 3. Transfundal pressure does not shorten the functional cervical length (no dynamic change).  Recommendations:  A. If no symptoms arise and patient's cervical length remains normal (currently applicable): 1. continue routine activities; ie, no need for pelvic rest and light exercise/routine daily activities are recommended 2.repeat cervical length in 2 weeks and again in 4 weeks.  If it remains normal, then we will have no indication to continue assessing cervical length, noting this plan is a precaution given the discrepancy between measurements in our office and reported to [place].  B.  If her cervix becomes <64mm: 1. modified rest/reduced activities (no heavy lifting or strenuous activities) and pelvic rest at home; 2. would admit for administration of antenatal steroids if cervical length is less than 1.5cm 3. would only administer 48 hour of tocolysis in context of regular uterine activity (ie, preterm labor or threatened preterm labor) 4. Progesterone 200mg  pv qhs until 36 weeks.  I spent in excess of 40 minutes in review of medical records, evaluation, and education of your patient in consultation. More than 50% of this time was spent in direct face-to-face counseling. It was a pleasure seeing your patient today in consultation. Thank you for allowing [place] the opportunity to contribute to the care of your patient.  Page with questions. Merideth Abbey, MD, MS, FACOG Assistant Professor, Maternal-Fetal Medicine

## 2013-04-10 ENCOUNTER — Other Ambulatory Visit: Payer: Self-pay | Admitting: Obstetrics and Gynecology

## 2013-04-10 DIAGNOSIS — O26879 Cervical shortening, unspecified trimester: Secondary | ICD-10-CM

## 2013-04-18 ENCOUNTER — Ambulatory Visit (HOSPITAL_COMMUNITY)
Admission: RE | Admit: 2013-04-18 | Discharge: 2013-04-18 | Disposition: A | Discharge status: Home / Self Care | Payer: 59 | Source: Ambulatory Visit | Attending: Obstetrics and Gynecology | Admitting: Obstetrics and Gynecology

## 2013-04-18 DIAGNOSIS — O26879 Cervical shortening, unspecified trimester: Secondary | ICD-10-CM | POA: Insufficient documentation

## 2013-04-18 DIAGNOSIS — O3440 Maternal care for other abnormalities of cervix, unspecified trimester: Secondary | ICD-10-CM | POA: Insufficient documentation

## 2013-04-21 ENCOUNTER — Encounter: Payer: Self-pay | Admitting: Obstetrics and Gynecology

## 2013-04-21 DIAGNOSIS — O26879 Cervical shortening, unspecified trimester: Secondary | ICD-10-CM | POA: Insufficient documentation

## 2013-04-30 ENCOUNTER — Other Ambulatory Visit (HOSPITAL_COMMUNITY): Payer: Self-pay | Admitting: Obstetrics and Gynecology

## 2013-04-30 DIAGNOSIS — O26879 Cervical shortening, unspecified trimester: Secondary | ICD-10-CM

## 2013-05-02 ENCOUNTER — Other Ambulatory Visit (HOSPITAL_COMMUNITY): Payer: Self-pay | Admitting: Obstetrics and Gynecology

## 2013-05-02 ENCOUNTER — Ambulatory Visit (HOSPITAL_COMMUNITY)
Admission: RE | Admit: 2013-05-02 | Discharge: 2013-05-02 | Disposition: A | Discharge status: Home / Self Care | Payer: 59 | Source: Ambulatory Visit | Attending: Obstetrics and Gynecology | Admitting: Obstetrics and Gynecology

## 2013-05-02 DIAGNOSIS — O26879 Cervical shortening, unspecified trimester: Secondary | ICD-10-CM | POA: Insufficient documentation

## 2013-05-02 DIAGNOSIS — O358XX0 Maternal care for other (suspected) fetal abnormality and damage, not applicable or unspecified: Secondary | ICD-10-CM | POA: Insufficient documentation

## 2013-05-02 DIAGNOSIS — Z1389 Encounter for screening for other disorder: Secondary | ICD-10-CM | POA: Insufficient documentation

## 2013-05-02 DIAGNOSIS — Z363 Encounter for antenatal screening for malformations: Secondary | ICD-10-CM | POA: Insufficient documentation

## 2013-05-02 DIAGNOSIS — O3440 Maternal care for other abnormalities of cervix, unspecified trimester: Secondary | ICD-10-CM | POA: Insufficient documentation

## 2013-05-05 ENCOUNTER — Other Ambulatory Visit (HOSPITAL_COMMUNITY): Payer: Self-pay | Admitting: Obstetrics and Gynecology

## 2013-05-05 DIAGNOSIS — O26879 Cervical shortening, unspecified trimester: Secondary | ICD-10-CM

## 2013-05-06 ENCOUNTER — Ambulatory Visit (HOSPITAL_COMMUNITY): Payer: 59 | Attending: Family Medicine

## 2013-05-14 ENCOUNTER — Other Ambulatory Visit (HOSPITAL_COMMUNITY): Payer: Self-pay | Admitting: Obstetrics and Gynecology

## 2013-05-14 DIAGNOSIS — O26879 Cervical shortening, unspecified trimester: Secondary | ICD-10-CM

## 2013-05-19 ENCOUNTER — Ambulatory Visit (HOSPITAL_COMMUNITY)
Admission: RE | Admit: 2013-05-19 | Discharge: 2013-05-19 | Disposition: A | Discharge status: Home / Self Care | Payer: 59 | Source: Ambulatory Visit | Attending: Obstetrics and Gynecology | Admitting: Obstetrics and Gynecology

## 2013-05-19 ENCOUNTER — Other Ambulatory Visit (HOSPITAL_COMMUNITY): Payer: Self-pay | Admitting: Obstetrics and Gynecology

## 2013-05-19 DIAGNOSIS — O26879 Cervical shortening, unspecified trimester: Secondary | ICD-10-CM | POA: Insufficient documentation

## 2013-05-19 DIAGNOSIS — O3440 Maternal care for other abnormalities of cervix, unspecified trimester: Secondary | ICD-10-CM | POA: Insufficient documentation

## 2013-05-24 ENCOUNTER — Encounter (HOSPITAL_COMMUNITY): Payer: Self-pay | Admitting: *Deleted

## 2013-05-24 ENCOUNTER — Inpatient Hospital Stay (HOSPITAL_COMMUNITY): Payer: 59

## 2013-05-24 ENCOUNTER — Inpatient Hospital Stay (HOSPITAL_COMMUNITY)
Admission: AD | Admit: 2013-05-24 | Discharge: 2013-05-24 | Disposition: A | Discharge status: Home / Self Care | Payer: 59 | Source: Ambulatory Visit | Attending: Obstetrics & Gynecology | Admitting: Obstetrics & Gynecology

## 2013-05-24 DIAGNOSIS — O26879 Cervical shortening, unspecified trimester: Secondary | ICD-10-CM | POA: Insufficient documentation

## 2013-05-24 DIAGNOSIS — O99891 Other specified diseases and conditions complicating pregnancy: Secondary | ICD-10-CM | POA: Insufficient documentation

## 2013-05-24 DIAGNOSIS — K219 Gastro-esophageal reflux disease without esophagitis: Secondary | ICD-10-CM | POA: Insufficient documentation

## 2013-05-24 DIAGNOSIS — R109 Unspecified abdominal pain: Secondary | ICD-10-CM | POA: Insufficient documentation

## 2013-05-24 LAB — URINALYSIS, ROUTINE W REFLEX MICROSCOPIC
Bilirubin Urine: NEGATIVE
Glucose, UA: NEGATIVE mg/dL
Hgb urine dipstick: NEGATIVE
Leukocytes, UA: NEGATIVE
Protein, ur: NEGATIVE mg/dL
Specific Gravity, Urine: 1.02 (ref 1.005–1.030)
Urobilinogen, UA: 0.2 mg/dL (ref 0.0–1.0)
pH: 7.5 (ref 5.0–8.0)

## 2013-05-24 LAB — FETAL FIBRONECTIN: Fetal Fibronectin: NEGATIVE

## 2013-05-24 NOTE — MAU Note (Signed)
Pain in upper abd woke me up about 0430. Pain radiated to back. Has eased off alittle. Pain is dull, sharp and irregular. Eases off some and then comes back. Denies n/v/d. Ate [place] food about 2200.

## 2013-05-24 NOTE — MAU Provider Note (Signed)
History     CSN: [id number]  Arrival date and time: 05/24/13 0605 Provider on unit: 0600 Provider notified: [phone] Provider at bedside: 0630     Chief Complaint  Patient presents with  . Abdominal Pain   HPI  [name] is a 27 yo G1P0 at 26.[redacted]wks gestation by ultrasound.  She presents this morning with complaints of  upper abdominal pain that radiates through to her back.  Her prenatal course has been complicated by a short cervix; which has been closely monitored and has been stable for last 2-3 weeks.  Her care is managed by both  Carloyn Jaeger, CNM & Dr. Cherly Hensen at [facility].  Past Medical History  Diagnosis Date  . Abnormal Pap smear 2014    ASCUS    Past Surgical History  Procedure Laterality Date  . Leep  2007  . Breast surgery Bilateral 2006    BREAST REDUCTION  . Wisdom tooth extraction    . Benign mass removed from armpit      Family History  Problem Relation Age of Onset  . Cancer Mother     uterine  . Cancer Maternal Grandfather     colon    History  Substance Use Topics  . Smoking status: Never Smoker   . Smokeless tobacco: Never Used  . Alcohol Use: 0.6 oz/week    1 Glasses of wine per week     Comment: Social when not preg.    Allergies:  Allergies  Allergen Reactions  . Robitussin Cough-Cold D [Pseudoephedrine-Dm-Gg] Hives    Prescriptions prior to admission  Medication Sig Dispense Refill  . Prenatal Vit-Fe Fumarate-FA (PRENATAL MULTIVITAMIN) TABS tablet Take 1 tablet by mouth daily at 12 noon.      Marland Kitchen VITAMIN D, ERGOCALCIFEROL, PO Take by mouth.      . nitrofurantoin, macrocrystal-monohydrate, (MACROBID) 100 MG capsule Take 1 capsule (100 mg total) by mouth 2 (two) times daily.  14 capsule  0  . ondansetron (ZOFRAN) 4 MG tablet Take 1-2 tablets (4-8 mg total) by mouth every 8 (eight) hours as needed for nausea.  30 tablet  2  . promethazine (PHENERGAN) 25 MG tablet Take 0.5-1 tablets (12.5-25 mg total) by mouth every 6 (six) hours  as needed for nausea.  30 tablet  2    Review of Systems  Constitutional: Negative.   HENT: Negative.   Eyes: Negative.   Respiratory: Negative.   Cardiovascular: Negative.   Gastrointestinal: Positive for abdominal pain.       Upper abd  Genitourinary: Negative.   Musculoskeletal: Negative.   Skin: Negative.   Neurological: Negative.   Endo/Heme/Allergies: Negative.   Psychiatric/Behavioral: Negative.    US Abdomen Complete  05/24/2013   CLINICAL DATA:  Abdominal pain  EXAM: ULTRASOUND ABDOMEN COMPLETE  COMPARISON:  None.  FINDINGS: Gallbladder:  No gallstones or wall thickening visualized. There is no pericholecystic fluid. No sonographic Murphy sign noted.  Common bile duct:  Diameter: 3 mm. There is no intrahepatic, common hepatic, or common bile duct dilatation.  Liver:  No focal lesion identified. Within normal limits in parenchymal echogenicity.  IVC:  No abnormality visualized.  Pancreas:  No mass or inflammatory focus.  Spleen:  Size and appearance within normal limits.  Right Kidney:  Length: 11.4 cm. Echogenicity within normal limits. No mass or hydronephrosis visualized.  Left Kidney:  Length: 11.4 cm. Echogenicity within normal limits. No mass or hydronephrosis visualized.  Abdominal aorta:  No aneurysm visualized.  Other findings:  No  demonstrable ascites.  IMPRESSION: Study within normal limits.   Electronically Signed   By: Bretta Bang M.D.   On: 05/24/2013 08:35    Physical Exam   Blood pressure 106/63, pulse 69, temperature 98.6 F (37 C), resp. rate 22, height 5\' 6"  (1.676 m), weight 104.055 kg (229 lb 6.4 oz), last menstrual period 11/22/2012.  Physical Exam  Constitutional: She is oriented to person, place, and time. She appears well-developed and well-nourished.  HENT:  Head: Normocephalic and atraumatic.  Eyes: EOM are normal. Pupils are equal, round, and reactive to light.  Neck: Normal range of motion. Neck supple.  Cardiovascular: Normal rate, regular  rhythm and normal heart sounds.   Respiratory: Effort normal and breath sounds normal.  GI: Soft. Bowel sounds are normal. There is tenderness.  epigastric area  Genitourinary: Vagina normal and uterus normal.  Musculoskeletal: Normal range of motion.  Neurological: She is alert and oriented to person, place, and time.  Skin: Skin is warm and dry.  VE: closed/thick/high  MAU Course  Procedures CCUA fFN CEFM Abdominal complete ultrasound  Assessment and Plan  IUP @ 26.[redacted]wks gestation  H/O LEEP H/O Short Cervix, antepartum - stable GERD  Discharge home Start taking Zantac 75 mg po daily Keep scheduled appointments  *Dr. Juliene Pina notified of plan - agrees  Kenard Gower, MSN, CNM 05/24/2013, 6:45 AM

## 2013-05-29 NOTE — L&D Delivery Note (Signed)
Delivery Note  First Stage: Labor onset: 0409 Augmentation : Cervical balloon, Pitocin, AROM Analgesia /Anesthesia intrapartum: epidural AROM at 0610  Second Stage: Complete dilation at 0612 Onset of pushing at 0612, CNM remained at bedside FHR second stage 145, moderate variability, no accels, variable and late decelerations  Delivery of a viable female at [phone] by CNM in LOA position tight nuchal cord x1, unable to reduce, delivered through Cord double clamped after cessation of pulsation, cut by father of baby Cord blood sample collected   Collection of cord blood donation n/a Arterial cord blood sample n/a  Third Stage: Placenta delivered immediately after infnt intact with 3 VC @ 0647, marginal cord insertion noted Placenta disposition: pathology Uterine tone firm / bleeding scant  Perineal abrasion-hemostatic Periurethral abrasion-hemostatic  Est. Blood Loss (mL): 100  Complications: none  Mom to postpartum.  Baby to NICU d/t weight  Newborn: Birth Weight: 1880 gm, 4-2  Apgar Scores: 8/9 Feeding planned: breast  Donette LarryBHAMBRI, Carlisha Wisler, N MSN, CNM 07/29/2013, 7:06 AM

## 2013-05-30 ENCOUNTER — Other Ambulatory Visit (HOSPITAL_COMMUNITY): Payer: Self-pay | Admitting: Obstetrics & Gynecology

## 2013-05-30 DIAGNOSIS — O26879 Cervical shortening, unspecified trimester: Secondary | ICD-10-CM

## 2013-06-03 ENCOUNTER — Other Ambulatory Visit (HOSPITAL_COMMUNITY): Payer: Self-pay | Admitting: Obstetrics & Gynecology

## 2013-06-03 ENCOUNTER — Ambulatory Visit (HOSPITAL_COMMUNITY)
Admission: RE | Admit: 2013-06-03 | Discharge: 2013-06-03 | Disposition: A | Discharge status: Home / Self Care | Payer: 59 | Source: Ambulatory Visit | Attending: Obstetrics & Gynecology | Admitting: Obstetrics & Gynecology

## 2013-06-03 DIAGNOSIS — O3440 Maternal care for other abnormalities of cervix, unspecified trimester: Secondary | ICD-10-CM | POA: Insufficient documentation

## 2013-06-03 DIAGNOSIS — O26879 Cervical shortening, unspecified trimester: Secondary | ICD-10-CM

## 2013-07-09 ENCOUNTER — Other Ambulatory Visit: Payer: Self-pay

## 2013-07-14 ENCOUNTER — Other Ambulatory Visit (HOSPITAL_COMMUNITY): Payer: Self-pay | Admitting: Certified Nurse Midwife

## 2013-07-14 DIAGNOSIS — IMO0002 Reserved for concepts with insufficient information to code with codable children: Secondary | ICD-10-CM

## 2013-07-17 ENCOUNTER — Encounter (HOSPITAL_COMMUNITY): Payer: Self-pay

## 2013-07-17 ENCOUNTER — Ambulatory Visit (HOSPITAL_COMMUNITY)
Admission: RE | Admit: 2013-07-17 | Discharge: 2013-07-17 | Disposition: A | Discharge status: Home / Self Care | Payer: 59 | Source: Ambulatory Visit | Attending: Certified Nurse Midwife | Admitting: Certified Nurse Midwife

## 2013-07-17 ENCOUNTER — Other Ambulatory Visit (HOSPITAL_COMMUNITY): Payer: Self-pay | Admitting: Certified Nurse Midwife

## 2013-07-17 DIAGNOSIS — O3440 Maternal care for other abnormalities of cervix, unspecified trimester: Secondary | ICD-10-CM | POA: Insufficient documentation

## 2013-07-17 DIAGNOSIS — O26879 Cervical shortening, unspecified trimester: Secondary | ICD-10-CM | POA: Insufficient documentation

## 2013-07-17 DIAGNOSIS — O365990 Maternal care for other known or suspected poor fetal growth, unspecified trimester, not applicable or unspecified: Secondary | ICD-10-CM | POA: Insufficient documentation

## 2013-07-17 DIAGNOSIS — IMO0002 Reserved for concepts with insufficient information to code with codable children: Secondary | ICD-10-CM

## 2013-07-21 ENCOUNTER — Ambulatory Visit (HOSPITAL_COMMUNITY): Payer: 59

## 2013-07-28 ENCOUNTER — Encounter (HOSPITAL_COMMUNITY): Payer: Self-pay

## 2013-07-28 ENCOUNTER — Inpatient Hospital Stay (HOSPITAL_COMMUNITY)
Admission: RE | Admit: 2013-07-28 | Discharge: 2013-07-31 | DRG: 775 | Disposition: A | Discharge status: Home / Self Care | Payer: 59 | Source: Ambulatory Visit | Attending: Obstetrics and Gynecology | Admitting: Obstetrics and Gynecology

## 2013-07-28 VITALS — BP 120/76 | HR 61 | Temp 97.8°F | Resp 18 | Ht 66.0 in | Wt 235.8 lb

## 2013-07-28 DIAGNOSIS — IMO0002 Reserved for concepts with insufficient information to code with codable children: Secondary | ICD-10-CM

## 2013-07-28 DIAGNOSIS — E669 Obesity, unspecified: Secondary | ICD-10-CM | POA: Diagnosis present

## 2013-07-28 DIAGNOSIS — O365990 Maternal care for other known or suspected poor fetal growth, unspecified trimester, not applicable or unspecified: Principal | ICD-10-CM | POA: Diagnosis present

## 2013-07-28 DIAGNOSIS — O99214 Obesity complicating childbirth: Secondary | ICD-10-CM

## 2013-07-28 DIAGNOSIS — Z809 Family history of malignant neoplasm, unspecified: Secondary | ICD-10-CM

## 2013-07-28 DIAGNOSIS — O26879 Cervical shortening, unspecified trimester: Secondary | ICD-10-CM | POA: Diagnosis present

## 2013-07-28 HISTORY — DX: Other specified health status: Z78.9

## 2013-07-28 MED ORDER — OXYCODONE-ACETAMINOPHEN 5-325 MG PO TABS: 1.0000 | ORAL_TABLET | ORAL | Status: DC | PRN

## 2013-07-28 MED ORDER — ONDANSETRON HCL 4 MG/2ML IJ SOLN: 4.0000 mg | Freq: Four times a day (QID) | INTRAMUSCULAR | Status: DC | PRN

## 2013-07-28 MED ORDER — FENTANYL CITRATE 0.05 MG/ML IJ SOLN: 100.0000 ug | INTRAMUSCULAR | Status: DC | PRN

## 2013-07-28 MED ORDER — ZOLPIDEM TARTRATE 5 MG PO TABS
5.0000 mg | ORAL_TABLET | Freq: Every evening | ORAL | Status: DC | PRN
  Administered 2013-07-29: 5 mg via ORAL
  Filled 2013-07-28: qty 1

## 2013-07-28 MED ORDER — PENICILLIN G POTASSIUM 5000000 UNITS IJ SOLR
5.0000 10*6.[IU] | Freq: Once | INTRAVENOUS | Status: AC
  Administered 2013-07-29: 5 10*6.[IU] via INTRAVENOUS
  Filled 2013-07-28: qty 5

## 2013-07-28 MED ORDER — OXYTOCIN 40 UNITS IN LACTATED RINGERS INFUSION - SIMPLE MED
1.0000 m[IU]/min | INTRAVENOUS | Status: DC
  Administered 2013-07-28: 1 m[IU]/min via INTRAVENOUS
  Filled 2013-07-28: qty 1000

## 2013-07-28 MED ORDER — LACTATED RINGERS IV SOLN
INTRAVENOUS | Status: DC
  Administered 2013-07-28: 23:00:00 via INTRAVENOUS

## 2013-07-28 MED ORDER — DEXTROSE 5 % IV SOLN
2.5000 10*6.[IU] | INTRAVENOUS | Status: DC
  Filled 2013-07-28 (×4): qty 2.5

## 2013-07-28 MED ORDER — CITRIC ACID-SODIUM CITRATE 334-500 MG/5ML PO SOLN: 30.0000 mL | ORAL | Status: DC | PRN

## 2013-07-28 MED ORDER — OXYTOCIN BOLUS FROM INFUSION
500.0000 mL | INTRAVENOUS | Status: DC
  Administered 2013-07-29: 500 mL via INTRAVENOUS

## 2013-07-28 MED ORDER — OXYTOCIN 40 UNITS IN LACTATED RINGERS INFUSION - SIMPLE MED
62.5000 mL/h | INTRAVENOUS | Status: DC
  Administered 2013-07-29: 62.5 mL/h via INTRAVENOUS

## 2013-07-28 MED ORDER — LACTATED RINGERS IV SOLN
500.0000 mL | INTRAVENOUS | Status: DC | PRN
  Administered 2013-07-29: 1000 mL via INTRAVENOUS

## 2013-07-28 MED ORDER — IBUPROFEN 600 MG PO TABS
600.0000 mg | ORAL_TABLET | Freq: Four times a day (QID) | ORAL | Status: DC | PRN
  Administered 2013-07-29: 600 mg via ORAL
  Filled 2013-07-28: qty 1

## 2013-07-28 MED ORDER — LIDOCAINE HCL (PF) 1 % IJ SOLN
30.0000 mL | INTRAMUSCULAR | Status: DC | PRN
  Filled 2013-07-28: qty 30

## 2013-07-28 NOTE — H&P (Signed)
OB ADMISSION/ HISTORY & PHYSICAL:  Admission Date: 07/28/2013  8:41 PM  Admit Diagnosis: 35.[redacted] weeks gestation, fetal growth restriction  Kayla Osborn is a 28 y.o. female presenting for IOL for asymmetric fetal growth restriction.  Prenatal History: G1P0   EDC:08/27/2013, by Last Menstrual Period   [facility]  Primary Ob Provider: Raelyn Moraolitta Dawson, CNM Prenatal course complicated by FGR and elevated UA doppler studies @33  wks [facility] @ 0% and EFW 27%. MFM consult complete and biweekly AT testing normal until sono today revealed AC again @0 %ile and EFW @5th %ile-4lb 5oz, AFI 14.5. H/o cervical shortening after LEEP.  Prenatal Labs: ABO, Rh: O (08/11 0000)  Antibody: Negative (08/11 0000) Rubella: Immune (08/11 0000)  RPR: Nonreactive (08/11 0000)  HBsAg: Negative (08/11 0000)  HIV: Non-reactive (08/11 0000)  GBS:   unknown 1 hr GTT : 121  Medical / Surgical History :  Past medical history:  Past Medical History  Diagnosis Date  . Abnormal Pap smear 2014    ASCUS  . Medical history non-contributory      Past surgical history:  Past Surgical History  Procedure Laterality Date  . Leep  2007  . Breast surgery Bilateral 2006    BREAST REDUCTION  . Wisdom tooth extraction    . Benign mass removed from armpit      Family History:  Family History  Problem Relation Age of Onset  . Cancer Mother     uterine  . Cancer Maternal Grandfather     colon     Social History:  reports that she has never smoked. She has never used smokeless tobacco. She reports that she drinks about 0.6 ounces of alcohol per week. She reports that she does not use illicit drugs.  Allergies: Robitussin cough-cold d   Current Medications at time of admission:  Prior to Admission medications   Medication Sig Start Date End Date Taking? Authorizing Provider  diphenhydramine-acetaminophen (TYLENOL PM) 25-500 MG TABS Take 1 tablet by mouth at bedtime as needed  (pain, sleep).   Yes Historical Provider, MD  Docosahexaenoic Acid (DHA PO) Take 1 tablet by mouth daily.   Yes Historical Provider, MD  Prenatal Vit-Fe Fumarate-FA (PRENATAL MULTIVITAMIN) TABS tablet Take 1 tablet by mouth daily at 12 noon.   Yes Historical Provider, MD     Review of Systems: +FM No ctx, LOF, or VB No HA, visual changes, or epigastric pain +dependent edema  Physical Exam:  VS: Blood pressure 142/88, pulse 65, temperature 98.6 F (37 C), temperature source Oral, resp. rate 18, height 5\' 6"  (1.676 m), weight 106.958 kg (235 lb 12.8 oz), last menstrual period 11/20/2012.  General: alert and oriented, appears comfortable, slightly anxious Heart: RRR Lungs: Clear lung fields Abdomen: Gravid, soft and non-tender, non-distended / uterus: gravid, non-tender Extremities: trace edema to BLE  Genitalia / VE: Dilation: 2 Effacement (%): 70 Station: -2 Exam by:: Melanie B. CNM Vertex Cervical balloon placed without difficulty  FHR: baseline rate 125 / variability moderate / accelerations present / no decelerations TOCO: none  Assessment: 35.[redacted] weeks gestation Fetal growth restriction First stage of labor FHR category I GBS unknown   Plan:  Admit for IOL. GBS prophylaxis. Efm per unit protocol. Cervical balloon and low dose Pitocin. Analgesia/anesthesia prn.  Reviewed with pt and spouse the benefits of delivery for newborn and potential NICU stay. All questions answered.  Dr. Cherly Hensenousins notified of admission / plan of care   [facility], Shawna OrleansMELANIE, N MSN,  CNM 07/28/2013, 11:31 PM

## 2013-07-29 ENCOUNTER — Inpatient Hospital Stay (HOSPITAL_COMMUNITY): Payer: 59 | Admitting: Anesthesiology

## 2013-07-29 ENCOUNTER — Encounter (HOSPITAL_COMMUNITY): Payer: 59 | Admitting: Anesthesiology

## 2013-07-29 ENCOUNTER — Encounter (HOSPITAL_COMMUNITY): Payer: Self-pay

## 2013-07-29 LAB — CBC
HEMATOCRIT: 38.8 % (ref 36.0–46.0)
HEMOGLOBIN: 13.2 g/dL (ref 12.0–15.0)
MCH: 29.3 pg (ref 26.0–34.0)
MCHC: 34 g/dL (ref 30.0–36.0)
MCV: 86.2 fL (ref 78.0–100.0)
Platelets: 261 10*3/uL (ref 150–400)
RBC: 4.5 MIL/uL (ref 3.87–5.11)
RDW: 14 % (ref 11.5–15.5)
WBC: 12.8 10*3/uL — ABNORMAL HIGH (ref 4.0–10.5)

## 2013-07-29 LAB — RPR: RPR Ser Ql: NONREACTIVE

## 2013-07-29 LAB — ABO/RH: ABO/RH(D): O POS

## 2013-07-29 LAB — OB RESULTS CONSOLE GBS: GBS: NEGATIVE

## 2013-07-29 LAB — GROUP B STREP BY PCR: Group B strep by PCR: NEGATIVE

## 2013-07-29 MED ORDER — DIPHENHYDRAMINE HCL 50 MG/ML IJ SOLN: 12.5000 mg | INTRAMUSCULAR | Status: DC | PRN

## 2013-07-29 MED ORDER — FENTANYL 2.5 MCG/ML BUPIVACAINE 1/10 % EPIDURAL INFUSION (WH - ANES)
INTRAMUSCULAR | Status: DC | PRN
  Administered 2013-07-29: 14 mL/h via EPIDURAL

## 2013-07-29 MED ORDER — OXYCODONE-ACETAMINOPHEN 5-325 MG PO TABS: 1.0000 | ORAL_TABLET | ORAL | Status: DC | PRN

## 2013-07-29 MED ORDER — LANOLIN HYDROUS EX OINT: TOPICAL_OINTMENT | CUTANEOUS | Status: DC | PRN

## 2013-07-29 MED ORDER — FENTANYL 2.5 MCG/ML BUPIVACAINE 1/10 % EPIDURAL INFUSION (WH - ANES)
14.0000 mL/h | INTRAMUSCULAR | Status: DC | PRN
  Filled 2013-07-29: qty 125

## 2013-07-29 MED ORDER — SENNOSIDES-DOCUSATE SODIUM 8.6-50 MG PO TABS
2.0000 | ORAL_TABLET | ORAL | Status: DC
  Administered 2013-07-29 – 2013-07-31 (×2): 2 via ORAL
  Filled 2013-07-29 (×2): qty 2

## 2013-07-29 MED ORDER — TETANUS-DIPHTH-ACELL PERTUSSIS 5-2.5-18.5 LF-MCG/0.5 IM SUSP
0.5000 mL | Freq: Once | INTRAMUSCULAR | Status: AC
  Administered 2013-07-30: 0.5 mL via INTRAMUSCULAR
  Filled 2013-07-29: qty 0.5

## 2013-07-29 MED ORDER — ONDANSETRON HCL 4 MG PO TABS: 4.0000 mg | ORAL_TABLET | ORAL | Status: DC | PRN

## 2013-07-29 MED ORDER — PRENATAL MULTIVITAMIN CH
1.0000 | ORAL_TABLET | Freq: Every day | ORAL | Status: DC
Start: 2013-07-29 — End: 2013-07-31
  Administered 2013-07-29: 1 via ORAL
  Filled 2013-07-29 (×2): qty 1

## 2013-07-29 MED ORDER — EPHEDRINE 5 MG/ML INJ
10.0000 mg | INTRAVENOUS | Status: DC | PRN
  Filled 2013-07-29: qty 4
  Filled 2013-07-29: qty 2

## 2013-07-29 MED ORDER — PHENYLEPHRINE 40 MCG/ML (10ML) SYRINGE FOR IV PUSH (FOR BLOOD PRESSURE SUPPORT)
80.0000 ug | PREFILLED_SYRINGE | INTRAVENOUS | Status: DC | PRN
  Filled 2013-07-29: qty 2

## 2013-07-29 MED ORDER — IBUPROFEN 600 MG PO TABS
600.0000 mg | ORAL_TABLET | Freq: Four times a day (QID) | ORAL | Status: DC
  Administered 2013-07-29 – 2013-07-31 (×8): 600 mg via ORAL
  Filled 2013-07-29 (×8): qty 1

## 2013-07-29 MED ORDER — PHENYLEPHRINE 40 MCG/ML (10ML) SYRINGE FOR IV PUSH (FOR BLOOD PRESSURE SUPPORT)
80.0000 ug | PREFILLED_SYRINGE | INTRAVENOUS | Status: DC | PRN
  Filled 2013-07-29: qty 10
  Filled 2013-07-29: qty 2

## 2013-07-29 MED ORDER — ONDANSETRON HCL 4 MG/2ML IJ SOLN: 4.0000 mg | INTRAMUSCULAR | Status: DC | PRN

## 2013-07-29 MED ORDER — EPHEDRINE 5 MG/ML INJ
10.0000 mg | INTRAVENOUS | Status: DC | PRN
  Filled 2013-07-29: qty 2

## 2013-07-29 MED ORDER — LACTATED RINGERS IV SOLN: 500.0000 mL | Freq: Once | INTRAVENOUS | Status: DC

## 2013-07-29 MED ORDER — ZOLPIDEM TARTRATE 5 MG PO TABS: 5.0000 mg | ORAL_TABLET | Freq: Every evening | ORAL | Status: DC | PRN

## 2013-07-29 MED ORDER — SIMETHICONE 80 MG PO CHEW: 80.0000 mg | CHEWABLE_TABLET | ORAL | Status: DC | PRN

## 2013-07-29 MED ORDER — LIDOCAINE HCL (PF) 1 % IJ SOLN
INTRAMUSCULAR | Status: DC | PRN
  Administered 2013-07-29 (×2): 9 mL

## 2013-07-29 NOTE — Progress Notes (Signed)
Delivery Note  First Stage: Labor onset: 0409 Augmentation : Cervical balloon, Pitocin, AROM Analgesia /Anesthesia intrapartum: epidural AROM at 0610  Second Stage: Complete dilation at 0612 Onset of pushing at 0612, CNM remained at bedside FHR second stage 145, moderate variability, no accels, variable and late decelerations  Delivery of a viable female at 0647 by CNM in LOA position tight nuchal cord x1, unable to reduce, delivered through Cord double clamped after cessation of pulsation, cut by father of baby Cord blood sample collected   Collection of cord blood donation n/a Arterial cord blood sample n/a  Third Stage: Placenta delivered immediately after infnt intact with 3 VC @ 0647, marginal cord insertion noted Placenta disposition: pathology Uterine tone firm / bleeding scant  Perineal abrasion-hemostatic Periurethral abrasion-hemostatic  Est. Blood Loss (mL): 100  Complications: none  Mom to postpartum.  Baby to NICU d/t weight  Newborn: Birth Weight: 1880 gm, 4-2  Apgar Scores: 8/9 Feeding planned: breast  Torri Michalski, N MSN, CNM 07/29/2013, 7:06 AM   

## 2013-07-29 NOTE — Progress Notes (Signed)
07/29/13 1530  Clinical Encounter Type  Visited With Patient and family together (SIL Angel)  Visit Type Initial;Spiritual support;Social support  Referral From Nurse (NT)  Spiritual Encounters  Spiritual Needs Prayer;Emotional  Stress Factors  Patient Stress Factors Loss of control;Lack of caregivers (Savvy's mom died around the time she learned of pregnancy)   Made initial visit with Gorgeous and her "sister" Lawanna Kobusngel (actually sister-in-law, her husband Prince's sister) on WU to introduce Spiritual Care and chaplain availability.  Sarin was just beginning to process delivery and NICU experience.  Per [place] BingJoy, Prince and DavisAngel are her biggest sources of support.  Her mother died of cancer around the time she learned of her pregnancy, so we talked briefly about grief and healing, especially around the milestone life event of becoming a mother herself.  Provided pastoral listening, reflection, grief education, affirmation, encouragement, and prayer at bedside per request.  Spiritual Care will follow for support, but please also page as needed:  [phone].  Thank you.  [address]Chaplain Denene Alamillo [place], [place] [phone]

## 2013-07-29 NOTE — Anesthesia Postprocedure Evaluation (Signed)
  Anesthesia Post-op Note  Patient: Kayla Osborn  Procedure(s) Performed: * No procedures listed *  Patient Location: PACU and Mother/Baby  Anesthesia Type:Epidural  Level of Consciousness: awake, alert  and oriented  Airway and Oxygen Therapy: Patient Spontanous Breathing  Post-op Pain: none  Post-op Assessment: Post-op Vital signs reviewed, Patient's Cardiovascular Status Stable, No headache, No backache, No residual numbness and No residual motor weakness  Post-op Vital Signs: Reviewed and stable  Complications: No apparent anesthesia complications

## 2013-07-29 NOTE — Progress Notes (Signed)
Subjective:   Called to room for fetal tracing. Pt comfortable with epidural.   Objective:   VS: Blood pressure 145/93, pulse 66, temperature 98.6 F (37 C), temperature source Oral, resp. rate 18, height 5\' 6"  (1.676 m), weight 106.958 kg (235 lb 12.8 oz), last menstrual period 11/20/2012, SpO2 97.00%. FHR : baseline 150 / variability mod / accelerations none / variable and late decelerations Toco: contractions every 2 minutes Cervix : 9/100/-1 Membranes: AROM, clear Pitocin: turned off  Assessment:  Labor: First stage active FHR category II  Plan:  Intrauterine resuscitation with o2 and IVF bolus. Facilitate delivery with pushing.     Donette LarryBHAMBRI, Talya Quain, N MSN, CNM 07/29/2013, 7:06 AM

## 2013-07-29 NOTE — Progress Notes (Signed)
Subjective:   On hands/knees, vomiting, uncomfortable with ctx. Cervical balloon out.  Objective:   VS: Blood pressure 152/85, pulse 66, temperature 98.6 F (37 C), temperature source Oral, resp. rate 18, height 5\' 6"  (1.676 m), weight 106.958 kg (235 lb 12.8 oz), last menstrual period 11/20/2012. FHR : baseline 135 / variability mod / accelerations present / no decelerations Toco: contractions every 2-3 minutes, moderate Cervix : 6/100/-2, BBOW Membranes: intact Pitocin: 325mu/min  Assessment:  Labor: First stage, active FHR category I  Plan:  GBS negative, PCN d/c'd. Continue Pitocin at current rate. Desires natural labor. Analgesia/anesthesia available. Anticipate SVD.     Donette LarryBHAMBRI, Asaad Gulley, N MSN, CNM 07/29/2013, 4:19 AM

## 2013-07-29 NOTE — Anesthesia Preprocedure Evaluation (Signed)
Anesthesia Evaluation  Patient identified by MRN, date of birth, ID band Patient awake    Reviewed: Allergy & Precautions, H&P , NPO status , Patient's Chart, lab work & pertinent test results  Airway Mallampati: II TM Distance: >3 FB Neck ROM: full    Dental no notable dental hx.    Pulmonary neg pulmonary ROS,          Cardiovascular negative cardio ROS      Neuro/Psych negative neurological ROS  negative psych ROS   GI/Hepatic negative GI ROS, Neg liver ROS,   Endo/Other  Morbid obesity  Renal/GU negative Renal ROS     Musculoskeletal   Abdominal (+) + obese,   Peds  Hematology negative hematology ROS (+)   Anesthesia Other Findings   Reproductive/Obstetrics (+) Pregnancy                           Anesthesia Physical Anesthesia Plan  ASA: III  Anesthesia Plan: Epidural   Post-op Pain Management:    Induction:   Airway Management Planned:   Additional Equipment:   Intra-op Plan:   Post-operative Plan:   Informed Consent: I have reviewed the patients History and Physical, chart, labs and discussed the procedure including the risks, benefits and alternatives for the proposed anesthesia with the patient or authorized representative who has indicated his/her understanding and acceptance.     Plan Discussed with:   Anesthesia Plan Comments:         Anesthesia Quick Evaluation

## 2013-07-29 NOTE — Anesthesia Procedure Notes (Signed)
Epidural Patient location during procedure: OB Start time: 07/29/2013 4:56 AM End time: 07/29/2013 5:00 AM  Staffing Anesthesiologist: Leilani AbleHATCHETT, Atilano Covelli Performed by: anesthesiologist   Preanesthetic Checklist Completed: patient identified, surgical consent, pre-op evaluation, timeout performed, IV checked, risks and benefits discussed and monitors and equipment checked  Epidural Patient position: sitting Prep: site prepped and draped and DuraPrep Patient monitoring: continuous pulse ox and blood pressure Approach: midline Location: L3-L4 Injection technique: LOR air  Needle:  Needle type: Tuohy  Needle gauge: 17 G Needle length: 9 cm and 9 Needle insertion depth: 8 cm Catheter type: closed end flexible Catheter size: 19 Gauge Catheter at skin depth: 15 cm Test dose: negative and Other  Assessment Sensory level: T10 Events: blood not aspirated, injection not painful, no injection resistance, negative IV test and no paresthesia  Additional Notes Reason for block:procedure for pain

## 2013-07-29 NOTE — Lactation Note (Signed)
This note was copied from the chart of Kayla Royal PiedraJoy Luhrs. Lactation Consultation Note    Initial consult with this mom of a NICU baby, now 6 hours post partum, and 35 6/[redacted] weeks gestation, and SGA. Mom wants to provide EBM/breast feed. She has had a breast reduction in the past, and isostrum. I asisted mom with trying to latch her baby. He was sleepy, and his mouth is small . Mom has flat nipples,  I told her to  do skin to skin for now, and  I will continue to work with her and her baby. Mom knows to call for questions/concerns.  Patient Name: Kayla Royal PiedraJoy Cataldi RUEAV'WToday's Date: 07/29/2013     Maternal Data    Feeding Feeding Type: Formula Nipple Type: Slow - flow Length of feed: 20 min  LATCH Score/Interventions                      Lactation Tools Discussed/Used     Consult Status      Kayla Osborn, Kayla Osborn 07/29/2013, 6:01 PM

## 2013-07-30 LAB — CBC
HEMATOCRIT: 32.4 % — AB (ref 36.0–46.0)
Hemoglobin: 11 g/dL — ABNORMAL LOW (ref 12.0–15.0)
MCH: 29.3 pg (ref 26.0–34.0)
MCHC: 34 g/dL (ref 30.0–36.0)
MCV: 86.2 fL (ref 78.0–100.0)
PLATELETS: 197 10*3/uL (ref 150–400)
RBC: 3.76 MIL/uL — AB (ref 3.87–5.11)
RDW: 14.3 % (ref 11.5–15.5)
WBC: 13.9 10*3/uL — AB (ref 4.0–10.5)

## 2013-07-30 NOTE — Lactation Note (Signed)
This note was copied from the chart of Boy Royal PiedraJoy Wire. Lactation Consultation Note    Follow up consuslt with this mom of a NICU baby, now 28 hours post partum, and 36 weeks corrected gestation. The baby os SGA, weighing 3 lbs 15 ounces.  I assisted mom with STS in the NICU, and the baby began to cue. Mom wanted to latch him, so I helped her position him, and he latched early today, while I compressed mom's brest tissue. I did not see sucking, but mom and baby seemed very content. I advised mom to increase her pump to every 3 hours, after she spends time with her baby in the NICU.   Patient Name: Boy Royal PiedraJoy Tramontana ZOXWR'UToday's Date: 07/30/2013 Reason for consult: Follow-up assessment;NICU baby   Maternal Data    Feeding Feeding Type: Breast Fed  LATCH Score/Interventions Latch: Too sleepy or reluctant, no latch achieved, no sucking elicited. (baby cuiong while STS with mom, I assisted mom with latching baby. He was happy to just stay latched,  no sucking noted) Intervention(s): Skin to skin;Teach feeding cues Intervention(s): Assist with latch  Audible Swallowing: None  Type of Nipple: Everted at rest and after stimulation (very soft but evert today)  Comfort (Breast/Nipple): Soft / non-tender     Hold (Positioning): Assistance needed to correctly position infant at breast and maintain latch. Intervention(s): Breastfeeding basics reviewed;Skin to skin  LATCH Score: 5  Lactation Tools Discussed/Used Pump Review: Setup, frequency, and cleaning (mom has been pumping every 4-5 hours - encouraged her to pump every 3 hours, 8 times a day)   Consult Status Consult Status: Follow-up Date: 07/31/13 Follow-up type: In-patient    Alfred LevinsLee, Christiann Hagerty Anne 07/30/2013, 11:01 AM

## 2013-07-30 NOTE — Progress Notes (Signed)
Ur chart review completed.  

## 2013-07-30 NOTE — Progress Notes (Signed)
Patient ID: Kayla Osborn, female   DOB: 07/30/1985, 28 y.o.   MRN: [id number] PPD # 1  Subjective: Pt reports feeling well / Pain controlled with ibuprofen Tolerating po/ Voiding without problems/ No n/v Bleeding is moderate Newborn info:  Information for the patient's newborn:  Kayla Osborn, Boy Kayla Osborn [[id number]][[id number]]  female  Feeding: breast (pumping) / Infant stable in NICU   Objective:  VS: Blood pressure 111/51, pulse 64, temperature 98.1 F (36.7 C), temperature source Oral, resp. rate 18  Recent Labs  07/28/13 2210 07/30/13 0700  WBC 12.8* 13.9*  HGB 13.2 11.0*  HCT 38.8 32.4*  PLT 261 197    Blood type: O POS Rubella: Immune    Physical Exam:  General:  alert, cooperative and no distress CV: Regular rate and rhythm Resp: clear Abdomen: soft, nontender, normal bowel sounds Uterine Fundus: firm, below umbilicus, nontender Perineum: minimal edema Lochia: moderate Ext: edema +1 and Homans sign is negative, no sign of DVT   A/P: PPD # 1/ G1P0101/ S/P: SVD with periurethral repair Doing well Continue routine post partum orders Anticipate D/C home tomorrow    Demetrius RevelFISHER,Tryce Surratt K, MSN, [facility] 07/30/2013, 10:05 AM

## 2013-07-31 ENCOUNTER — Encounter (HOSPITAL_COMMUNITY)
Admission: RE | Admit: 2013-07-31 | Discharge: 2013-07-31 | Disposition: A | Discharge status: Home / Self Care | Payer: 59 | Source: Ambulatory Visit | Attending: Obstetrics & Gynecology | Admitting: Obstetrics & Gynecology

## 2013-07-31 ENCOUNTER — Ambulatory Visit: Payer: Self-pay

## 2013-07-31 DIAGNOSIS — O923 Agalactia: Secondary | ICD-10-CM | POA: Insufficient documentation

## 2013-07-31 MED ORDER — IBUPROFEN 600 MG PO TABS: 600.0000 mg | ORAL_TABLET | Freq: Four times a day (QID) | ORAL | Status: DC | PRN

## 2013-07-31 NOTE — Discharge Summary (Signed)
Obstetric Discharge Summary Reason for Admission: G1 P0 @ 35wks 5days for IOL d/t asymmetric fetal growth restriction     GBS status unknown; prophylaxis planned Prenatal Procedures: NST and ultrasound Intrapartum Procedures: spontaneous vaginal delivery Postpartum Procedures: none Complications-Operative and Postpartum: none Hemoglobin  Date Value Ref Range Status  07/30/2013 11.0* 12.0 - 15.0 g/dL Final     HCT  Date Value Ref Range Status  07/30/2013 32.4* 36.0 - 46.0 % Final    Physical Exam:  General: alert, cooperative and no distress Lochia: appropriate Uterine Fundus: firm Incision: n/a DVT Evaluation: No evidence of DVT seen on physical exam. Negative Homan's sign.  Discharge Diagnoses: G1 P1 s/p SVD with periurethral repair @ 35w 5d  Discharge Information: Date: 07/31/2013 Activity: pelvic rest Diet: routine Medications: PNV, Ibuprofen and Colace Condition: stable Instructions: refer to practice specific booklet Discharge to: home Follow-up Information   Follow up with BHAMBRI, MELANIE, N, CNM In 6 weeks.   Specialty:  Obstetrics and Gynecology   Contact information:   [address] [place] [id number] [phone]       Newborn Data: Live born female on 07/29/13 Birth Weight: 3 lb 15.1 oz (1790 g) APGAR: 8, 9  Infant remaining in NICU.  FISHER,JULIE K 07/31/2013, 8:53 AM

## 2013-07-31 NOTE — Progress Notes (Signed)
Pt discharged home with sister in law... Condition stable... No equipment... Pt wants to visit her baby in the NICU and then will leave from there after her visit.

## 2013-07-31 NOTE — Lactation Note (Signed)
This note was copied from the chart of Kayla Royal PiedraJoy Guida. Lactation Consultation Note     Follow up consult with this mom of a NICU baby, now 36 1/7 weeks corrected gestation, and 53 hours post partum. Mom began expressing transitional milk last night - 5 mls. She is very excited. She is being discharged today, rented a [employer], and was instructed in it's use. Discharge teaching on pumping done. Mom has sore nipples - slightly pink and discomfort with pumping. i increased mom to size 24 flanges - good fit when the slow suction starts. I also gave mom comfort gels to use, and instructed her in their use. I will follow this family in the NICU.  Patient Name: Kayla Osborn WGNFA'OToday's Date: 07/31/2013 Reason for consult: Follow-up assessment;NICU baby   Maternal Data    Feeding    LATCH Score/Interventions                      Lactation Tools Discussed/Used Tools: Pump   Consult Status Consult Status: Follow-up Follow-up type:  (prn in NICU)    Alfred LevinsLee, Sigmond Patalano Anne 07/31/2013, 12:40 PM

## 2013-07-31 NOTE — Progress Notes (Signed)
Patient ID: Kayla Osborn, female   DOB: 12/19/1985, 28 y.o.   MRN: [id number] PPD # 2  Subjective: Pt reports feeling well / Pain controlled with ibuprofen Tolerating po/ Voiding without problems/ No n/v Bleeding is light/ Newborn info:  Information for the patient's newborn:  Kayla Osborn, Boy Tanica [[id number]][[id number]]  female Infant remains in NICU; stable Feeding: breast (pumping)    Objective:  VS: Blood pressure 120/76, pulse 61, temperature 97.8 F (36.6 C), temperature source Oral, resp. rate 18.   Recent Labs  07/28/13 2210 07/30/13 0700  WBC 12.8* 13.9*  HGB 13.2 11.0*  HCT 38.8 32.4*  PLT 261 197    Blood type: O POS Rubella: Immune   Physical Exam:  General:  alert, cooperative and no distress CV: Regular rate and rhythm Resp: clear Abdomen: soft, nontender, normal bowel sounds Uterine Fundus: firm, below umbilicus, nontender Perineum: not inspected , pt is fully dressed Lochia: minimal Ext: edema +1 and Homans sign is negative, no sign of DVT    A/P: PPD # 2/ G1P0101/ S/P: SVD with periurethral repair Doing well and stable for discharge home RX: Ibuprofen 600mg  po Q 6 hrs prn pain #30 Refill x 1 Colace 100mg  po up to TID, OTC WOB/GYN booklet given Routine pp visit in 6wks   Demetrius RevelFISHER,Cameryn Schum K, MSN, [facility] 07/31/2013, 8:51 AM

## 2013-11-29 ENCOUNTER — Emergency Department (HOSPITAL_COMMUNITY): Payer: 59

## 2013-11-29 ENCOUNTER — Emergency Department (HOSPITAL_COMMUNITY)
Admission: EM | Admit: 2013-11-29 | Discharge: 2013-11-29 | Disposition: A | Discharge status: Home / Self Care | Payer: 59 | Attending: Emergency Medicine | Admitting: Emergency Medicine

## 2013-11-29 ENCOUNTER — Encounter (HOSPITAL_COMMUNITY): Payer: Self-pay | Admitting: Emergency Medicine

## 2013-11-29 DIAGNOSIS — M546 Pain in thoracic spine: Secondary | ICD-10-CM | POA: Insufficient documentation

## 2013-11-29 DIAGNOSIS — Z3202 Encounter for pregnancy test, result negative: Secondary | ICD-10-CM | POA: Insufficient documentation

## 2013-11-29 LAB — URINALYSIS, ROUTINE W REFLEX MICROSCOPIC
BILIRUBIN URINE: NEGATIVE
GLUCOSE, UA: NEGATIVE mg/dL
Hgb urine dipstick: NEGATIVE
KETONES UR: NEGATIVE mg/dL
Nitrite: NEGATIVE
PROTEIN: NEGATIVE mg/dL
Specific Gravity, Urine: 1.029 (ref 1.005–1.030)
UROBILINOGEN UA: 0.2 mg/dL (ref 0.0–1.0)
pH: 6.5 (ref 5.0–8.0)

## 2013-11-29 LAB — URINE MICROSCOPIC-ADD ON

## 2013-11-29 LAB — CBC WITH DIFFERENTIAL/PLATELET
BASOS ABS: 0 10*3/uL (ref 0.0–0.1)
Basophils Relative: 0 % (ref 0–1)
EOS PCT: 3 % (ref 0–5)
Eosinophils Absolute: 0.2 10*3/uL (ref 0.0–0.7)
HEMATOCRIT: 39.3 % (ref 36.0–46.0)
Hemoglobin: 12.8 g/dL (ref 12.0–15.0)
LYMPHS ABS: 2.8 10*3/uL (ref 0.7–4.0)
LYMPHS PCT: 36 % (ref 12–46)
MCH: 28.5 pg (ref 26.0–34.0)
MCHC: 32.6 g/dL (ref 30.0–36.0)
MCV: 87.5 fL (ref 78.0–100.0)
Monocytes Absolute: 0.8 10*3/uL (ref 0.1–1.0)
Monocytes Relative: 10 % (ref 3–12)
Neutro Abs: 4 10*3/uL (ref 1.7–7.7)
Neutrophils Relative %: 51 % (ref 43–77)
PLATELETS: 249 10*3/uL (ref 150–400)
RBC: 4.49 MIL/uL (ref 3.87–5.11)
RDW: 13.8 % (ref 11.5–15.5)
WBC: 7.8 10*3/uL (ref 4.0–10.5)

## 2013-11-29 LAB — BASIC METABOLIC PANEL
ANION GAP: 13 (ref 5–15)
BUN: 10 mg/dL (ref 6–23)
CALCIUM: 9.3 mg/dL (ref 8.4–10.5)
CHLORIDE: 102 meq/L (ref 96–112)
CO2: 25 mEq/L (ref 19–32)
Creatinine, Ser: 0.74 mg/dL (ref 0.50–1.10)
GFR calc Af Amer: >90 mL/min (ref >90)
GFR calc non Af Amer: >90 mL/min (ref >90)
Glucose, Bld: 88 mg/dL (ref 70–99)
Potassium: 4.4 mEq/L (ref 3.7–5.3)
SODIUM: 140 meq/L (ref 137–147)

## 2013-11-29 LAB — POC URINE PREG, ED: PREG TEST UR: NEGATIVE

## 2013-11-29 MED ORDER — DIAZEPAM 5 MG PO TABS
5.0000 mg | ORAL_TABLET | Freq: Once | ORAL | Status: DC
  Filled 2013-11-29: qty 1

## 2013-11-29 MED ORDER — KETOROLAC TROMETHAMINE 60 MG/2ML IM SOLN
60.0000 mg | Freq: Once | INTRAMUSCULAR | Status: DC
  Filled 2013-11-29: qty 2

## 2013-11-29 MED ORDER — DIAZEPAM 5 MG PO TABS: 5.0000 mg | ORAL_TABLET | Freq: Three times a day (TID) | ORAL | Status: AC | PRN

## 2013-11-29 NOTE — ED Provider Notes (Signed)
CSN: [id number]     Arrival date & time 11/29/13  1956 History   First MD Initiated Contact with Patient 11/29/13 2027     Chief Complaint  Patient presents with  . Back Pain     (Consider location/radiation/quality/duration/timing/severity/associated sxs/prior Treatment) HPI Kayla Osborn is a 28 y.o. female who presents emergency department complaining of back pain. Patient states her pain began yesterday in the middle of the day. She states pain is bilateral across mid back. She states that she did not injure her back as far she can remember. She did go to spin class III days ago. Patient is also 4 months postpartum. She states she picks up her son and carries the baby chair but she usually doesn't daily. She states today the pain started to radiate up to her neck. She states pain is worsened with movement. She denies any shortness of breath. She denies any fever or chills. No cough. She denies any urinary symptoms. She took Advil for her pain with very mild relief. She denies any prior back pain or similar pain in the past. She denies abdominal pain. No nausea, vomiting, GI symptoms.  Past Medical History  Diagnosis Date  . Abnormal Pap smear 2014    ASCUS  . Medical history non-contributory   . Postpartum care following vaginal delivery (07/29/13) 07/29/2013  . SVD (spontaneous vaginal delivery) 07/29/2013   Past Surgical History  Procedure Laterality Date  . Leep  2007  . Breast surgery Bilateral 2006    BREAST REDUCTION  . Wisdom tooth extraction    . Benign mass removed from armpit     Family History  Problem Relation Age of Onset  . Cancer Mother     uterine  . Cancer Maternal Grandfather     colon   History  Substance Use Topics  . Smoking status: Never Smoker   . Smokeless tobacco: Never Used  . Alcohol Use: 0.6 oz/week    1 Glasses of wine per week     Comment: Social when not preg.   OB History   Grav Para Term Preterm Abortions TAB SAB Ect Mult Living   1 1  1       1      Review of Systems  Constitutional: Negative for fever and chills.  Respiratory: Negative for cough, chest tightness and shortness of breath.   Cardiovascular: Negative for chest pain, palpitations and leg swelling.  Gastrointestinal: Negative for nausea, vomiting, abdominal pain and diarrhea.  Genitourinary: Negative for dysuria, flank pain and pelvic pain.  Musculoskeletal: Positive for back pain. Negative for arthralgias, gait problem, joint swelling, myalgias, neck pain and neck stiffness.  Skin: Negative for rash.  Neurological: Negative for dizziness, weakness and headaches.  All other systems reviewed and are negative.     Allergies  Robitussin cough-cold d  Home Medications   Prior to Admission medications   Not on File   BP 140/84  Pulse 70  Temp(Src) 98.3 F (36.8 C) (Oral)  Resp 18  Ht 5\' 6"  (1.676 m)  Wt 228 lb 4.8 oz (103.556 kg)  BMI 36.87 kg/m2  SpO2 100%  LMP 11/24/2013  Breastfeeding? No Physical Exam  Nursing note and vitals reviewed. Constitutional: She appears well-developed and well-nourished. No distress.  HENT:  Head: Normocephalic.  Eyes: Conjunctivae are normal.  Neck: Neck supple.  Cardiovascular: Normal rate, regular rhythm and normal heart sounds.   Pulmonary/Chest: Effort normal and breath sounds normal. No respiratory distress. She has no wheezes. She  has no rales.  Abdominal: Soft. Bowel sounds are normal. She exhibits no distension. There is no tenderness. There is no rebound.  No CVA tenderness bilaterally  Musculoskeletal: She exhibits no edema.  No midline cervical, thoracic, lumbar spine tenderness. Tender over right trapezius, right. Scapular muscles, bilateral mid back muscular tenderness. Her range of motion of bilateral upper and lower extremities. No pain with bilateral straight leg raise. Negative Homans sign bilaterally  Neurological: She is alert.  5/5 and equal lower extremity strength. 2+ and equal patellar reflexes  bilaterally. Pt able to dorsiflex bilateral toes and feet with good strength against resistance. Equal sensation bilaterally over thighs and lower legs.   Skin: Skin is warm and dry.  Psychiatric: She has a normal mood and affect. Her behavior is normal.    ED Course  Procedures (including critical care time) Labs Review Labs Reviewed  URINALYSIS, ROUTINE W REFLEX MICROSCOPIC - Abnormal; Notable for the following:    APPearance CLOUDY (*)    Leukocytes, UA SMALL (*)    All other components within normal limits  URINE MICROSCOPIC-ADD ON - Abnormal; Notable for the following:    Squamous Epithelial / LPF FEW (*)    Bacteria, UA FEW (*)    All other components within normal limits  URINE CULTURE  CBC WITH DIFFERENTIAL  BASIC METABOLIC PANEL  POC URINE PREG, ED    Imaging Review Dg Chest 2 View  11/29/2013   CLINICAL DATA:  Mid back pain, shortness of breath  EXAM: CHEST  2 VIEW  COMPARISON:  None.  FINDINGS: Lungs are clear.  No pleural effusion or pneumothorax.  The heart is top-normal in size.  Visualized osseous structures are within normal limits.  IMPRESSION: No evidence of acute cardiopulmonary disease.   Electronically Signed   By: Charline BillsSriyesh  Krishnan M.D.   On: 11/29/2013 21:40     EKG Interpretation None      MDM   Final diagnoses:  Bilateral thoracic back pain    Patient's with end date back pain and right trapezius and periscapular muscle pain. Pain is reproducible with palpation it worsened with movement. She is PERC negative, doubt PE. She has no lower extremity swelling or pain in her calf, negative Homans sign bilaterally. She's afebrile. No CVA tenderness. I suspect her pain is musculoskeletal. I will get a chest x-ray, patient states she does have some pain with breathing, also will get blood tests and urinalysis. Ordered Valium and Toradol for her symptoms.  10:41 PM CXR negative. CBC and metabolic panel normal. UA contaminated no WBCs, will send cultures. Pain  is reproducible with palpation and movement. Suspect musculoskeletal. Pt refused medications in ED. Will d/c home with ibuprofen and valium for spasms. Heating pads. Stretches. Instructed to return if worsening symptoms, abdominal pain, weakness or numbness, fever, return to ER. Pt agreed to the plan  Filed Vitals:   11/29/13 2030 11/29/13 2045 11/29/13 2100 11/29/13 2115  BP: 134/81  118/83   Pulse: 82 83 79 65  Temp:      TempSrc:      Resp:      Height:      Weight:      SpO2: 100% 100% 98% 100%     Kayla Jacobsonatyana A Meredith Mells, PA-C 11/29/13 2248

## 2013-11-29 NOTE — ED Notes (Signed)
Patient presents with mid back pain on the left mid back and also states that her neck hurts and pulls on her back when she moves her head.  Increased pain when she takes a deep breath.  Patient is 4 months post partum

## 2013-11-29 NOTE — Discharge Instructions (Signed)
Lab work, chest x-ray, urine analysis all negative today. Take valium as prescribed as needed for spasms. Ibuprofen for pain. Follow up with primary care doctor as needed. Return if worsening.   Back Pain, Adult Low back pain is very common. About 1 in 5 people have back pain.The cause of low back pain is rarely dangerous. The pain often gets better over time.About half of people with a sudden onset of back pain feel better in just 2 weeks. About 8 in 10 people feel better by 6 weeks.  CAUSES Some common causes of back pain include:  Strain of the muscles or ligaments supporting the spine.  Wear and tear (degeneration) of the spinal discs.  Arthritis.  Direct injury to the back. DIAGNOSIS Most of the time, the direct cause of low back pain is not known.However, back pain can be treated effectively even when the exact cause of the pain is unknown.Answering your caregiver's questions about your overall health and symptoms is one of the most accurate ways to make sure the cause of your pain is not dangerous. If your caregiver needs more information, he or she may order lab work or imaging tests (X-rays or MRIs).However, even if imaging tests show changes in your back, this usually does not require surgery. HOME CARE INSTRUCTIONS For many people, back pain returns.Since low back pain is rarely dangerous, it is often a condition that people can learn to [facility]ageon their own.   Remain active. It is stressful on the back to sit or stand in one place. Do not sit, drive, or stand in one place for more than 30 minutes at a time. Take short walks on level surfaces as soon as pain allows.Try to increase the length of time you walk each day.  Do not stay in bed.Resting more than 1 or 2 days can delay your recovery.  Do not avoid exercise or work.Your body is made to move.It is not dangerous to be active, even though your back may hurt.Your back will likely heal faster if you return to being  active before your pain is gone.  Pay attention to your body when you bend and lift. Many people have less discomfortwhen lifting if they bend their knees, keep the load close to their bodies,and avoid twisting. Often, the most comfortable positions are those that put less stress on your recovering back.  Find a comfortable position to sleep. Use a firm mattress and lie on your side with your knees slightly bent. If you lie on your back, put a pillow under your knees.  Only take over-the-counter or prescription medicines as directed by your caregiver. Over-the-counter medicines to reduce pain and inflammation are often the most helpful.Your caregiver may prescribe muscle relaxant drugs.These medicines help dull your pain so you can more quickly return to your normal activities and healthy exercise.  Put ice on the injured area.  Put ice in a plastic bag.  Place a towel between your skin and the bag.  Leave the ice on for 15-20 minutes, 03-04 times a day for the first 2 to 3 days. After that, ice and heat may be alternated to reduce pain and spasms.  Ask your caregiver about trying back exercises and gentle massage. This may be of some benefit.  Avoid feeling anxious or stressed.Stress increases muscle tension and can worsen back pain.It is important to recognize when you are anxious or stressed and learn ways to manage it.Exercise is a great option. SEEK MEDICAL CARE IF:  You have  pain that is not relieved with rest or medicine.  You have pain that does not improve in 1 week.  You have new symptoms.  You are generally not feeling well. SEEK IMMEDIATE MEDICAL CARE IF:   You have pain that radiates from your back into your legs.  You develop new bowel or bladder control problems.  You have unusual weakness or numbness in your arms or legs.  You develop nausea or vomiting.  You develop abdominal pain.  You feel faint. Document Released: 05/15/2005 Document Revised:  11/14/2011 Document Reviewed: 10/03/2010 [facility] Patient Information 2015 [place] [place]. This information is not intended to replace advice given to you by your health care provider. Make sure you discuss any questions you have with your health care provider.

## 2013-11-30 NOTE — ED Provider Notes (Signed)
Medical screening examination/treatment/procedure(s) were performed by non-physician practitioner and as supervising physician I was immediately available for consultation/collaboration.  Shanna CiscoMegan E Docherty, MD 11/30/13 1218

## 2013-12-01 LAB — URINE CULTURE

## 2014-03-30 ENCOUNTER — Encounter (HOSPITAL_COMMUNITY): Payer: Self-pay | Admitting: Emergency Medicine

## 2015-07-06 IMAGING — US US MFM OB TRANSVAGINAL
1 series · 13 of 18 positions shown · non-contrast
Comparison: none

[Series 1: us mfm ob transvaginal · 0.28mm/px · 13 of 18 slices shown]
[im 1/18]
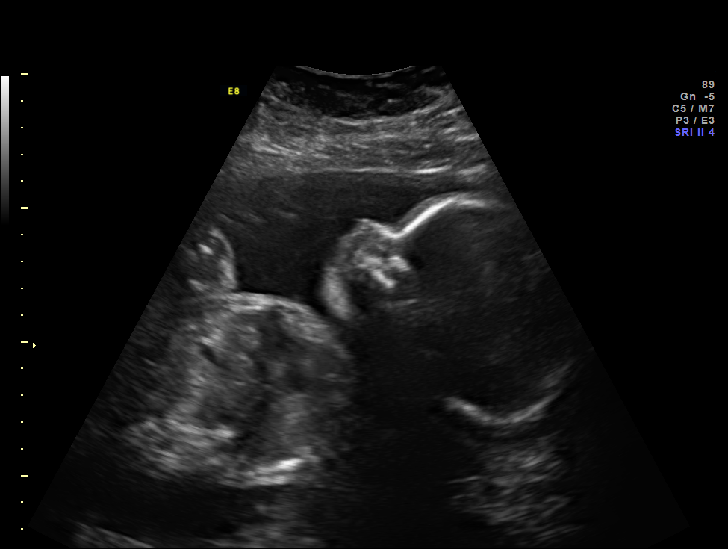
[im 3/18]
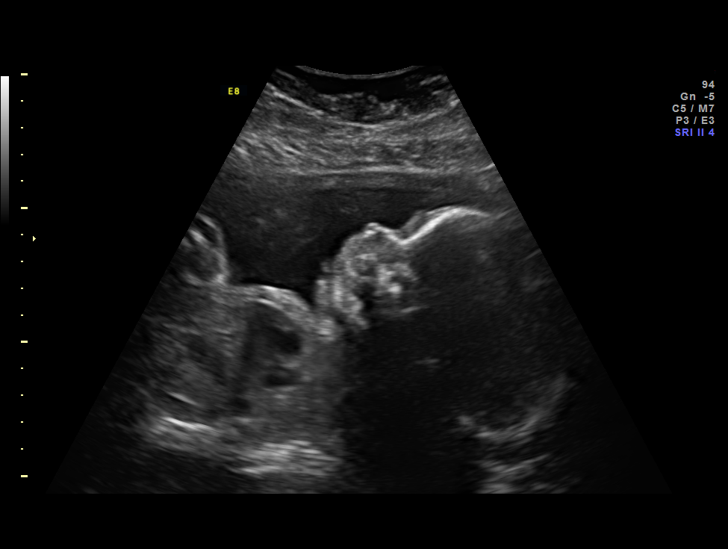
[im 4/18]
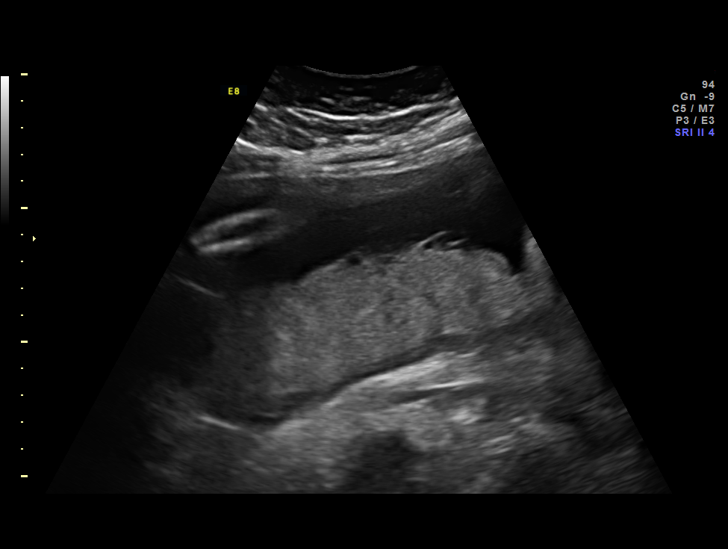
[im 5/18]
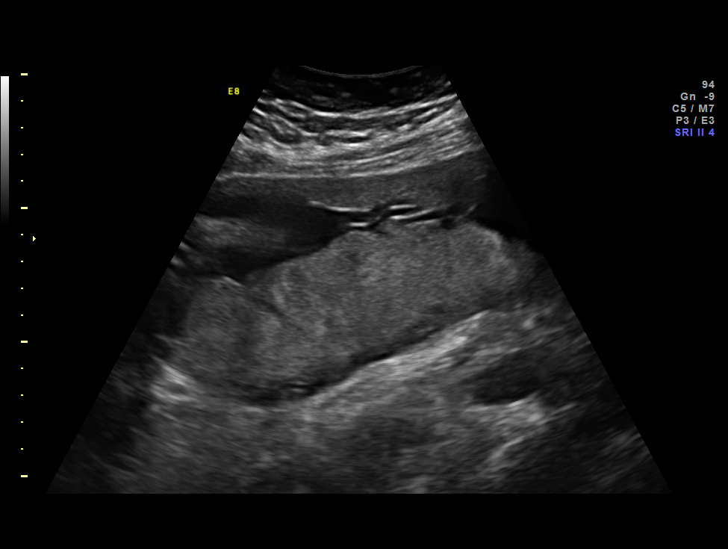
[im 7/18]
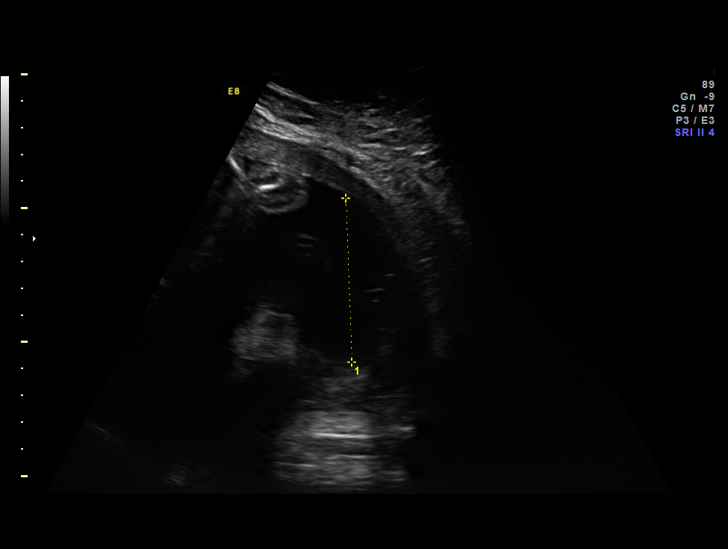
[im 8/18]
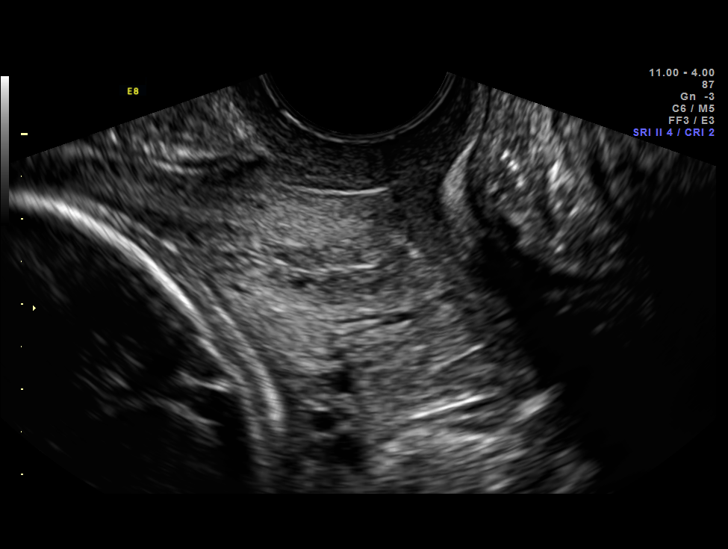
[im 10/18]
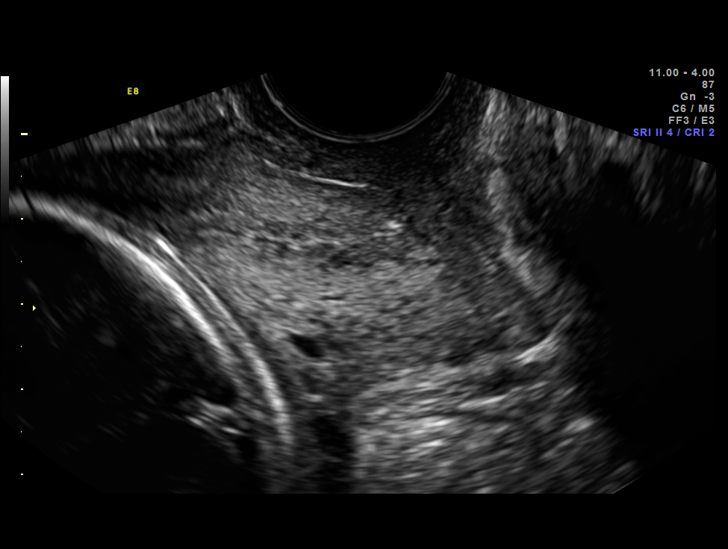
[im 11/18]
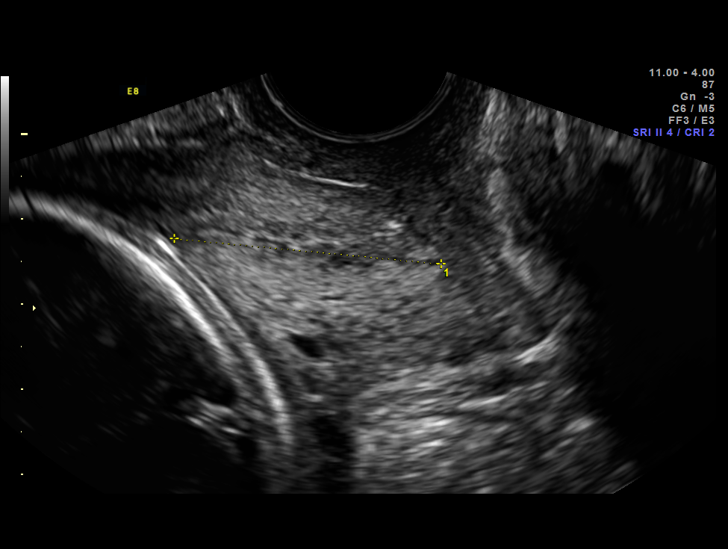
[im 12/18]
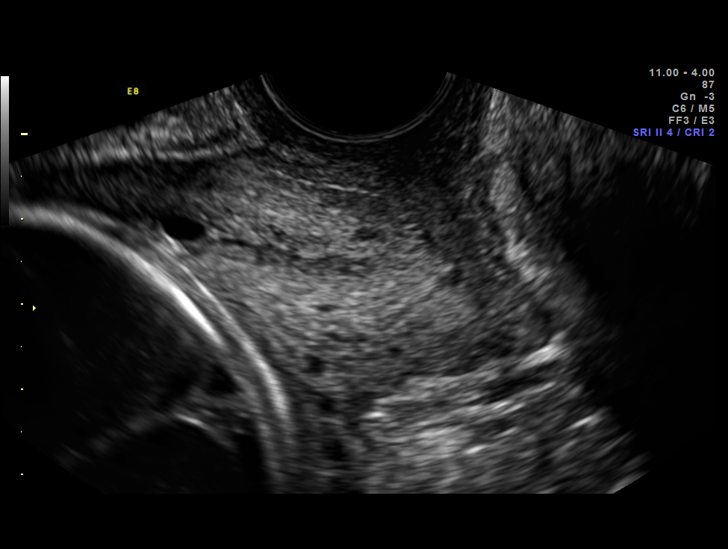
[im 14/18]
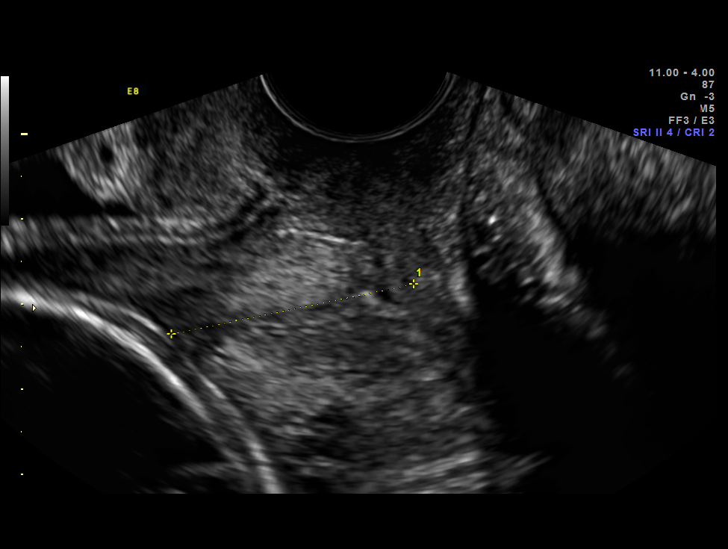
[im 15/18]
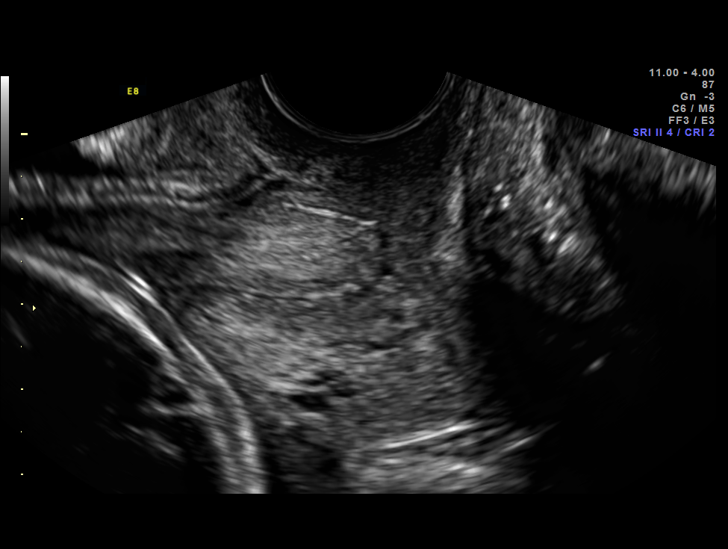
[im 16/18]
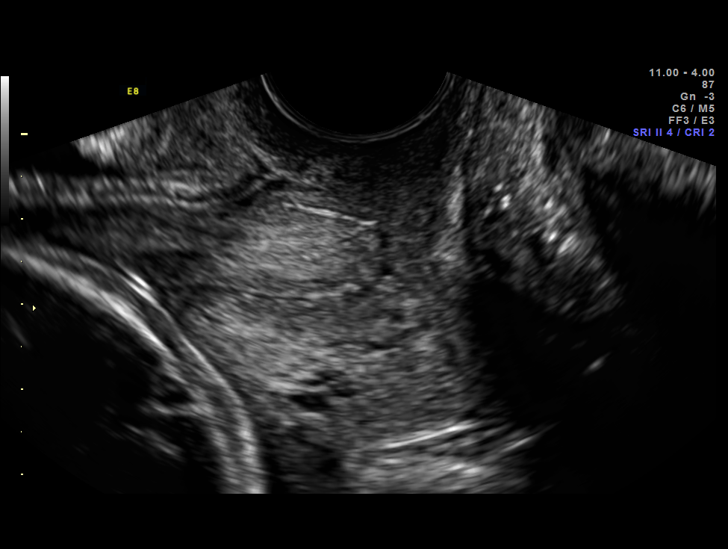
[im 18/18]
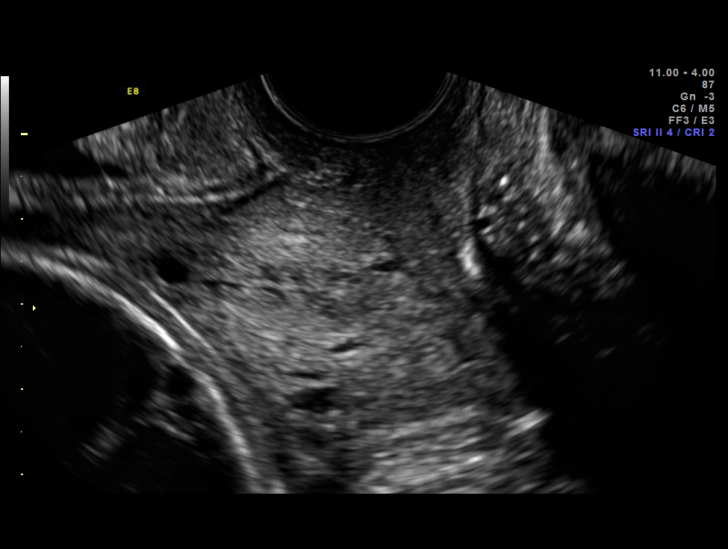

[13 of 18 positions shown; findings below may reference images not displayed]

OBSTETRICS REPORT
                      (Signed Final 06/03/2013 [DATE])

 Name:       RUDI JUMPER                     Visit Date: 06/03/2013 [DATE]

Service(s) Provided

 US MFM OB TRANSVAGINAL                                76817.2
Indications

 Cervical shortening
 Previous cervical surgery (LEEP)
Fetal Evaluation

 Num Of Fetuses:    1
 Fetal Heart Rate:  131                          bpm
 Cardiac Activity:  Observed
 Presentation:      Cephalic
 Placenta:          Posterior, above cervical
                    os
 P. Cord            Previously Visualized
 Insertion:

 Amniotic Fluid
 AFI FV:      Subjectively within normal limits
                                             Larg Pckt:     6.1  cm
Gestational Age

 LMP:           27w 6d        Date:  11/20/12                 EDD:   08/27/13
 Best:          27w 6d     Det. By:  LMP  (11/20/12)          EDD:   08/27/13
Cervix Uterus Adnexa

 Cervical Length:    2.7      cm

 Cervix:       Measured transvaginally.
Impression

 IUP at 27+6 weeks
 EV views of cervix: normal length without funneling
Recommendations

 Follow-up as clinically indicated; follow cervix clinically
 Thank you for sharing in the care of Ms. RUDI JUMPER with
 questions or concerns.
